# Patient Record
Sex: Female | Born: 1965
Health system: Southern US, Community
[De-identification: ages and names within clinical notes are randomized; demographics above are authoritative.]

## PROBLEM LIST (undated history)

## (undated) DIAGNOSIS — D649 Anemia, unspecified: Secondary | ICD-10-CM

## (undated) HISTORY — PX: TUBAL LIGATION: SHX77

## (undated) HISTORY — DX: Anemia, unspecified: D64.9

---

## 2003-09-20 ENCOUNTER — Ambulatory Visit (HOSPITAL_COMMUNITY): Admission: RE | Admit: 2003-09-20 | Discharge: 2003-09-20 | Payer: Self-pay | Admitting: Obstetrics and Gynecology

## 2005-03-19 ENCOUNTER — Encounter: Admission: RE | Admit: 2005-03-19 | Discharge: 2005-03-19 | Payer: Self-pay | Admitting: *Deleted

## 2005-04-02 ENCOUNTER — Encounter: Admission: RE | Admit: 2005-04-02 | Discharge: 2005-04-02 | Payer: Self-pay | Admitting: *Deleted

## 2005-04-02 ENCOUNTER — Encounter (INDEPENDENT_AMBULATORY_CARE_PROVIDER_SITE_OTHER): Payer: Self-pay | Admitting: Specialist

## 2005-09-13 ENCOUNTER — Encounter: Admission: RE | Admit: 2005-09-13 | Discharge: 2005-09-13 | Payer: Self-pay | Admitting: *Deleted

## 2006-03-25 ENCOUNTER — Encounter: Admission: RE | Admit: 2006-03-25 | Discharge: 2006-03-25 | Payer: Self-pay | Admitting: *Deleted

## 2006-04-11 ENCOUNTER — Encounter: Admission: RE | Admit: 2006-04-11 | Discharge: 2006-04-11 | Payer: Self-pay | Admitting: *Deleted

## 2007-03-31 ENCOUNTER — Encounter: Admission: RE | Admit: 2007-03-31 | Discharge: 2007-03-31 | Payer: Self-pay

## 2008-04-05 ENCOUNTER — Encounter: Admission: RE | Admit: 2008-04-05 | Discharge: 2008-04-05 | Payer: Self-pay

## 2008-08-05 ENCOUNTER — Encounter: Admission: RE | Admit: 2008-08-05 | Discharge: 2008-08-05 | Payer: Self-pay | Admitting: Specialist

## 2009-04-07 ENCOUNTER — Encounter: Admission: RE | Admit: 2009-04-07 | Discharge: 2009-04-07 | Payer: Self-pay | Admitting: Physician Assistant

## 2010-04-17 ENCOUNTER — Encounter: Admission: RE | Admit: 2010-04-17 | Discharge: 2010-04-17 | Payer: Self-pay | Admitting: Physician Assistant

## 2010-10-23 NOTE — H&P (Signed)
NAME:  Ariana, [name] NO.:  [id number]   MEDICAL RECORD NO.:  [id number]                   PATIENT TYPE:   LOCATION:                                       FACILITY:   PHYSICIAN:  Lenoard Aden, M.D.             DATE OF BIRTH:   DATE OF ADMISSION:  DATE OF DISCHARGE:                                HISTORY & PHYSICAL   CHIEF COMPLAINT:  Desire for elective sterilization.   HISTORY OF PRESENT ILLNESS:  The patient is a 45 year old Hispanic female,  G4, P3, who desires permanent sterilization.   PAST GYN HISTORY:  Noncontributory.   PREGNANCY HISTORY:  One uncomplicated abortion, three uncomplicated vaginal  deliveries.   MEDICATIONS:  Oral contraception.   FAMILY HISTORY:  Family history of kidney disease.   SOCIAL HISTORY:  She is a nonsmoker, nondrinker, denies domestic or physical  violence.   PHYSICAL EXAMINATION:  GENERAL:  She is a well-developed, well-nourished  Hispanic female in no acute distress.  HEENT:  Normal.  LUNGS:  Clear.  HEART:  Regular rhythm.  ABDOMEN:  Soft, nontender.  PELVIC:  Exam shows normal sized uterus, no __________masses.   IMPRESSION:  Desire for elective sterilization.   PLAN:  Proceed with laparoscopic tubal ligation.  Risks of anesthesia,  infection, bleeding, injury to vital organs, need for repair are discussed.  The__________complications to include bowel and bladder injury are noted.  The risk of tubal ligation in 5-10 per thousand is noted.  The patient  acknowledges and wishes to proceed.                                               Lenoard Aden, M.D.    RJT/MEDQ  D:  09/19/2003  T:  09/19/2003  Job:  [id number]

## 2010-10-23 NOTE — Op Note (Signed)
NAME:  [place], [facility]                           ACCOUNT NO.:  [id number]   MEDICAL RECORD NO.:  [id number]                   PATIENT TYPE:  AMB   LOCATION:  SDC                                  FACILITY:  [facility]   PHYSICIAN:  Lenoard Aden, M.D.             DATE OF BIRTH:  07/21/1965   DATE OF PROCEDURE:  09/20/2003  DATE OF DISCHARGE:                                 OPERATIVE REPORT   PREOPERATIVE DIAGNOSIS:  Elective sterilization.   POSTOPERATIVE DIAGNOSIS:  Elective sterilization.   PROCEDURE:  Laparoscopic tubal sterilization.   ANESTHESIA:  General.   ESTIMATED BLOOD LOSS:  Less than 50 cc.   COMPLICATIONS:  None.   DRAINS:  None.   COUNTS:  Correct.   DISPOSITION:  Patient to recovery in good condition.   FINDINGS:  Normal uterus.  Bilateral normal tubes and ovaries.  Normal  appendiceal bed.  Appendix not visualized.  Normal liver and gallbladder  bed.   BRIEF OPERATIVE NOTE:  After being apprised of the risks of anesthesia,  infection, bleeding, injury to abdominal organs with need for repair,  delayed version or immediate complications to include: bowel and bladder  injury, failure risk of tubal ligation of 5:10,000.   The patient is brought to the operating room, where she was administered  general anesthetic without complications; prepped and draped in usual  sterile fashion. She was catheterized until the bladder was emptied.  After  achieving adequate anesthesia, dilute Marcaine solution is placed in the  area of Pfannenstiel skin incision, which was made with the scalpel.  This  was carried down to the fascia, which is nicked in the midline and opened  transversely.  Infraumbilical incision made with the scalpel.  Veress needle  placed, opening pressure -2 noted. Four liters of CO2 insufflated without  difficulty.  Hanging drop test is negative.  The trocar is placed without  difficulty and a normal intraperitoneal entry without evidence of trauma is  documented.  Pictures are taken of normal liver, gallbladder bed,  appendiceal bed appears normal.  Normal tubes, ovaries and uterus.  Normal  anterior and posterior cul-de-sac is noted.  Clevenger bipolar cautery is  entered through the operative port of the scope.  The right tube is traced  out to the fimbriated end, fulgurated with bipolar cautery down to  resistance of 0 at three contiguous portions of the ampulla, isthmic portion  of the tube.  The same procedure is done on the left tube.  Tubal fimbria  are visualized bilaterally.  Tubes are divided.  Lumens are visualized using  long scissors, and CO2 is released.  Good hemostasis is  noted.  CO2 is then reentered into the belly, achieving good hemostasis.  All instruments are removed from the abdomen.  Incision is closed using a 0  Vicryl and Dermabond.  Instruments are removed from the vagina.  The patient  tolerated the procedure well and  is transferred to recovery in good  condition.                                               Lenoard Aden, M.D.    RJT/MEDQ  D:  09/20/2003  T:  09/21/2003  Job:  [id number]

## 2011-03-22 ENCOUNTER — Other Ambulatory Visit: Payer: Self-pay | Admitting: Internal Medicine

## 2011-03-22 DIAGNOSIS — Z1231 Encounter for screening mammogram for malignant neoplasm of breast: Secondary | ICD-10-CM

## 2011-04-23 ENCOUNTER — Ambulatory Visit
Admission: RE | Admit: 2011-04-23 | Discharge: 2011-04-23 | Disposition: A | Discharge status: Home / Self Care | Payer: BC Managed Care – PPO | Source: Ambulatory Visit | Attending: Internal Medicine | Admitting: Internal Medicine

## 2011-04-23 DIAGNOSIS — Z1231 Encounter for screening mammogram for malignant neoplasm of breast: Secondary | ICD-10-CM

## 2012-03-09 ENCOUNTER — Other Ambulatory Visit: Payer: Self-pay | Admitting: Specialist

## 2012-03-09 DIAGNOSIS — Z1231 Encounter for screening mammogram for malignant neoplasm of breast: Secondary | ICD-10-CM

## 2012-04-28 ENCOUNTER — Ambulatory Visit
Admission: RE | Admit: 2012-04-28 | Discharge: 2012-04-28 | Disposition: A | Discharge status: Home / Self Care | Payer: BC Managed Care – PPO | Source: Ambulatory Visit | Attending: Specialist | Admitting: Specialist

## 2012-04-28 DIAGNOSIS — Z1231 Encounter for screening mammogram for malignant neoplasm of breast: Secondary | ICD-10-CM

## 2013-03-15 ENCOUNTER — Other Ambulatory Visit: Payer: Self-pay

## 2013-03-15 DIAGNOSIS — Z1231 Encounter for screening mammogram for malignant neoplasm of breast: Secondary | ICD-10-CM

## 2013-04-30 ENCOUNTER — Ambulatory Visit
Admission: RE | Admit: 2013-04-30 | Discharge: 2013-04-30 | Disposition: A | Discharge status: Home / Self Care | Payer: BC Managed Care – PPO | Source: Ambulatory Visit

## 2013-04-30 DIAGNOSIS — Z1231 Encounter for screening mammogram for malignant neoplasm of breast: Secondary | ICD-10-CM

## 2013-06-25 ENCOUNTER — Ambulatory Visit (INDEPENDENT_AMBULATORY_CARE_PROVIDER_SITE_OTHER): Payer: BC Managed Care – PPO | Admitting: Internal Medicine

## 2013-06-25 ENCOUNTER — Ambulatory Visit: Payer: BC Managed Care – PPO

## 2013-06-25 VITALS — BP 118/80 | HR 65 | Temp 98.6°F | Resp 16 | Ht 64.5 in | Wt 152.0 lb

## 2013-06-25 DIAGNOSIS — M25552 Pain in left hip: Secondary | ICD-10-CM

## 2013-06-25 DIAGNOSIS — M79609 Pain in unspecified limb: Secondary | ICD-10-CM

## 2013-06-25 DIAGNOSIS — M79605 Pain in left leg: Secondary | ICD-10-CM

## 2013-06-25 DIAGNOSIS — M25559 Pain in unspecified hip: Secondary | ICD-10-CM

## 2013-06-25 DIAGNOSIS — R202 Paresthesia of skin: Secondary | ICD-10-CM

## 2013-06-25 DIAGNOSIS — R209 Unspecified disturbances of skin sensation: Secondary | ICD-10-CM

## 2013-06-25 LAB — POCT CBC
Granulocyte percent: 52.6 %G (ref 37–80)
HCT, POC: 38.7 % (ref 37.7–47.9)
Hemoglobin: 11.8 g/dL — AB (ref 12.2–16.2)
Lymph, poc: 1.9 (ref 0.6–3.4)
MCH, POC: 26 pg — AB (ref 27–31.2)
MCHC: 30.5 g/dL — AB (ref 31.8–35.4)
MCV: 85.4 fL (ref 80–97)
MID (cbc): 0.4 (ref 0–0.9)
MPV: 10.2 fL (ref 0–99.8)
POC Granulocyte: 2.5 (ref 2–6.9)
POC LYMPH PERCENT: 40 %L (ref 10–50)
POC MID %: 7.4 %M (ref 0–12)
Platelet Count, POC: 237 10*3/uL (ref 142–424)
RBC: 4.53 M/uL (ref 4.04–5.48)
RDW, POC: 16.3 %
WBC: 4.8 10*3/uL (ref 4.6–10.2)

## 2013-06-25 LAB — POCT SEDIMENTATION RATE: POCT SED RATE: 36 mm/hr — AB (ref 0–22)

## 2013-06-25 MED ORDER — METHYLPREDNISOLONE ACETATE 80 MG/ML IJ SUSP
80.0000 mg | Freq: Once | INTRAMUSCULAR | Status: AC
  Administered 2013-06-25: 80 mg via INTRAMUSCULAR

## 2013-06-25 MED ORDER — NAPROXEN 500 MG PO TABS: 500.0000 mg | ORAL_TABLET | Freq: Two times a day (BID) | ORAL | Status: DC

## 2013-06-25 NOTE — Progress Notes (Signed)
   Subjective:    Patient ID: Ariana ReichmannSilvia [name], female    DOB: [birth year], 48 y.o.   MRN: [id number]  HPI    Review of Systems     Objective:   Physical Exam        Assessment & Plan:

## 2013-06-25 NOTE — Progress Notes (Signed)
   Subjective:    Patient ID: Ariana ReichmannSilvia Canlas, female    DOB: Oct 24, 1965, 48 y.o.   MRN: [id number]  HPI Pt here c/o of hip pain left side started last year comes and goes.  Worse with movement. radiates to left leg. 10/10 with movement.  Denies pain with urination. Bowel moving okay.  Some tingling and numbness.  otc with some relief.  Activity, walking, working can trigger pain, but bending, flexing does not trigger pain. Pain will ache into anterior left leg to the knee.   Review of Systems     Objective:   Physical Exam  Constitutional: She appears well-developed and well-nourished. No distress.  HENT:  Head: Normocephalic.  Eyes: EOM are normal.  Neck: Normal range of motion.  Cardiovascular: Normal rate.   Pulmonary/Chest: Effort normal.  Musculoskeletal:       Left hip: She exhibits tenderness and bony tenderness. She exhibits normal range of motion, normal strength, no swelling, no crepitus and no deformity.       Lumbar back: She exhibits normal range of motion, no tenderness, no bony tenderness, no pain, no spasm and normal pulse.  Neurological: She is alert. No cranial nerve deficit or sensory deficit. She exhibits normal muscle tone. Coordination and gait normal.   Pain with external rotation left hip,internal rotation and palpation lateral posterior hip  [facility] reading (PRIMARY) by  Dr Perrin MalteseGuest hip and ls spine appear normal on xr  Results for orders placed in visit on 06/25/13  POCT CBC      Result Value Range   WBC 4.8  4.6 - 10.2 K/uL   Lymph, poc 1.9  0.6 - 3.4   POC LYMPH PERCENT 40.0  10 - 50 %L   MID (cbc) 0.4  0 - 0.9   POC MID % 7.4  0 - 12 %M   POC Granulocyte 2.5  2 - 6.9   Granulocyte percent 52.6  37 - 80 %G   RBC 4.53  4.04 - 5.48 M/uL   Hemoglobin 11.8 (*) 12.2 - 16.2 g/dL   HCT, POC 40.938.7  81.137.7 - 47.9 %   MCV 85.4  80 - 97 fL   MCH, POC 26.0 (*) 27 - 31.2 pg   MCHC 30.5 (*) 31.8 - 35.4 g/dL   RDW, POC 91.416.3     Platelet Count, POC 237  142 - 424  K/uL   MPV 10.2  0 - 99.8 fL          Assessment & Plan:  Left hip and leg pain/Possible bursitis Depomedrol 80mg /Naprosyn 500mg  bid pc Reck Friday 30th

## 2013-06-25 NOTE — Patient Instructions (Addendum)
Hip Exercises RANGE OF MOTION (ROM) AND STRETCHING EXERCISES  These exercises may help you when beginning to rehabilitate your injury. Doing them too aggressively can worsen your condition. Complete them slowly and gently. Your symptoms may resolve with or without further involvement from your physician, physical therapist or athletic trainer. While completing these exercises, remember:   Restoring tissue flexibility helps normal motion to return to the joints. This allows healthier, less painful movement and activity.  An effective stretch should be held for at least 30 seconds.  A stretch should never be painful. You should only feel a gentle lengthening or release in the stretched tissue. If these stretches worsen your symptoms even when done gently, consult your physician, physical therapist or athletic trainer. STRETCH Hamstrings, Supine   Lie on your back. Loop a belt or towel over the ball of your right / left foot.  Straighten your right / left knee and slowly pull on the belt to raise your leg. Do not allow the right / left knee to bend. Keep your opposite leg flat on the floor.  Raise the leg until you feel a gentle stretch behind your right / left knee or thigh. Hold this position for __________ seconds. Repeat __________ times. Complete this stretch __________ times per day.  STRETCH - Hip Rotators   Lie on your back on a firm surface. Grasp your right / left knee with your right / left hand and your ankle with your opposite hand.  Keeping your hips and shoulders firmly planted, gently pull your right / left knee and rotate your lower leg toward your opposite shoulder until you feel a stretch in your buttocks.  Hold this stretch for __________ seconds. Repeat this stretch __________ times. Complete this stretch __________ times per day. STRETCH - Hamstrings/Adductors, V-Sit   Sit on the floor with your legs extended in a large "V," keeping your knees straight.  With your head  and chest upright, bend at your waist reaching for your right foot to stretch your left adductors.  You should feel a stretch in your left inner thigh. Hold for __________ seconds.  Return to the upright position to relax your leg muscles.  Continuing to keep your chest upright, bend straight forward at your waist to stretch your hamstrings.  You should feel a stretch behind both of your thighs and/or knees. Hold for __________ seconds.  Return to the upright position to relax your leg muscles.  Repeat steps 2 through 4 for opposite leg. Repeat __________ times. Complete this exercise __________ times per day.  STRETCHING - Hip Flexors, Lunge  Half kneel with your right / left knee on the floor and your opposite knee bent and directly over your ankle.  Keep good posture with your head over your shoulders. Tighten your buttocks to point your tailbone downward; this will prevent your back from arching too much.  You should feel a gentle stretch in the front of your thigh and/or hip. If you do not feel any resistance, slightly slide your opposite foot forward and then slowly lunge forward so your knee once again lines up over your ankle. Be sure your tailbone remains pointed downward.  Hold this stretch for __________ seconds. Repeat __________ times. Complete this stretch __________ times per day. STRENGTHENING EXERCISES These exercises may help you when beginning to rehabilitate your injury. They may resolve your symptoms with or without further involvement from your physician, physical therapist or athletic trainer. While completing these exercises, remember:   Muscles can  gain both the endurance and the strength needed for everyday activities through controlled exercises.  Complete these exercises as instructed by your physician, physical therapist or athletic trainer. Progress the resistance and repetitions only as guided.  You may experience muscle soreness or fatigue, but the pain  or discomfort you are trying to eliminate should never worsen during these exercises. If this pain does worsen, stop and make certain you are following the directions exactly. If the pain is still present after adjustments, discontinue the exercise until you can discuss the trouble with your clinician. STRENGTH - Hip Extensors, Bridge   Lie on your back on a firm surface. Bend your knees and place your feet flat on the floor.  Tighten your buttocks muscles and lift your bottom off the floor until your trunk is level with your thighs. You should feel the muscles in your buttocks and back of your thighs working. If you do not feel these muscles, slide your feet 1-2 inches further away from your buttocks.  Hold this position for __________ seconds.  Slowly lower your hips to the starting position and allow your buttock muscles relax completely before beginning the next repetition.  If this exercise is too easy, you may cross your arms over your chest. Repeat __________ times. Complete this exercise __________ times per day.  STRENGTH - Hip Abductors, Straight Leg Raises  Be aware of your form throughout the entire exercise so that you exercise the correct muscles. Sloppy form means that you are not strengthening the correct muscles.  Lie on your side so that your head, shoulders, knee and hip line up. You may bend your lower knee to help maintain your balance. Your right / left leg should be on top.  Roll your hips slightly forward, so that your hips are stacked directly over each other and your right / left knee is facing forward.  Lift your top leg up 4-6 inches, leading with your heel. Be sure that your foot does not drift forward or that your knee does not roll toward the ceiling.  Hold this position for __________ seconds. You should feel the muscles in your outer hip lifting (you may not notice this until your leg begins to tire).  Slowly lower your leg to the starting position. Allow the  muscles to fully relax before beginning the next repetition. Repeat __________ times. Complete this exercise __________ times per day.  STRENGTH - Hip Adductors, Straight Leg Raises   Lie on your side so that your head, shoulders, knee and hip line up. You may place your upper foot in front to help maintain your balance. Your right / left leg should be on the bottom.  Roll your hips slightly forward, so that your hips are stacked directly over each other and your right / left knee is facing forward.  Tense the muscles in your inner thigh and lift your bottom leg 4-6 inches. Hold this position for __________ seconds.  Slowly lower your leg to the starting position. Allow the muscles to fully relax before beginning the next repetition. Repeat __________ times. Complete this exercise __________ times per day.  STRENGTH - Quadriceps, Straight Leg Raises  Quality counts! Watch for signs that the quadriceps muscle is working to insure you are strengthening the correct muscles and not "cheating" by substituting with healthier muscles.  Lay on your back with your right / left leg extended and your opposite knee bent.  Tense the muscles in the front of your right / left thigh.  You should see either your knee cap slide up or increased dimpling just above the knee. Your thigh may even quiver.  Tighten these muscles even more and raise your leg 4 to 6 inches off the floor. Hold for right / left seconds.  Keeping these muscles tense, lower your leg.  Relax the muscles slowly and completely in between each repetition. Repeat __________ times. Complete this exercise __________ times per day.  STRENGTH - Hip Abductors, Standing  Tie one end of a rubber exercise band/tubing to a secure surface (table, pole) and tie a loop at the other end.  Place the loop around your right / left ankle. Keeping your ankle with the band directly opposite of the secured end, step away until there is tension in the  tube/band.  Hold onto a chair as needed for balance.  Keeping your back upright, your shoulders over your hips, and your toes pointing forward, lift your right / left leg out to your side. Be sure to lift your leg with your hip muscles. Do not "throw" your leg or tip your body to lift your leg.  Slowly and with control, return to the starting position. Repeat exercise __________ times. Complete this exercise __________ times per day.  STRENGTH  Quadriceps, Squats  Stand in a door frame so that your feet and knees are in line with the frame.  Use your hands for balance, not support, on the frame.  Slowly lower your weight, bending at the hips and knees. Keep your lower legs upright so that they are parallel with the door frame. Squat only within the range that does not increase your knee pain. Never let your hips drop below your knees.  Slowly return upright, pushing with your legs, not pulling with your hands. Document Released: 06/11/2005 Document Revised: 08/16/2011 Document Reviewed: 09/05/2008 [facility] Patient Information 2014 [place], [place]. Hip Bursitis Bursitis is a swelling and soreness (inflammation) of a fluid-filled sac (bursa). This sac overlies and protects the joints.  CAUSES   Injury.  Overuse of the muscles surrounding the joint.  Arthritis.  Gout.  Infection.  Cold weather.  Inadequate warm-up and conditioning prior to activities. The cause may not be known.  SYMPTOMS   Mild to severe irritation.  Tenderness and swelling over the outside of the hip.  Pain with motion of the hip.  If the bursa becomes infected, a fever may be present. Redness, tenderness, and warmth will develop over the hip. Symptoms usually lessen in 3 to 4 weeks with treatment, but can come back. TREATMENT If conservative treatment does not work, your caregiver may advise draining the bursa and injecting cortisone into the area. This may speed up the healing process. This may also  be used as an initial treatment of choice. HOME CARE INSTRUCTIONS   Apply ice to the affected area for 15-20 minutes every 3 to 4 hours while awake for the first 2 days. Put the ice in a plastic bag and place a towel between the bag of ice and your skin.  Rest the painful joint as much as possible, but continue to put the joint through a normal range of motion at least 4 times per day. When the pain lessens, begin normal, slow movements and usual activities to help prevent stiffness of the hip.  Only take over-the-counter or prescription medicines for pain, discomfort, or fever as directed by your caregiver.  Use crutches to limit weight bearing on the hip joint, if advised.  Elevate your painful hip to  reduce swelling. Use pillows for propping and cushioning your legs and hips.  Gentle massage may provide comfort and decrease swelling. SEEK IMMEDIATE MEDICAL CARE IF:   Your pain increases even during treatment, or you are not improving.  You have a fever.  You have heat and inflammation over the involved bursa.  You have any other questions or concerns. MAKE SURE YOU:   Understand these instructions.  Will watch your condition.  Will get help right away if you are not doing well or get worse. Document Released: 11/13/2001 Document Revised: 08/16/2011 Document Reviewed: 06/12/2008 [facility] Patient Information 2014 [place], [place].

## 2013-06-29 ENCOUNTER — Ambulatory Visit (INDEPENDENT_AMBULATORY_CARE_PROVIDER_SITE_OTHER): Payer: BC Managed Care – PPO | Admitting: Physician Assistant

## 2013-06-29 VITALS — BP 120/72 | HR 62 | Temp 98.0°F | Resp 18 | Ht 64.5 in | Wt 153.0 lb

## 2013-06-29 DIAGNOSIS — M25552 Pain in left hip: Secondary | ICD-10-CM

## 2013-06-29 DIAGNOSIS — D649 Anemia, unspecified: Secondary | ICD-10-CM

## 2013-06-29 DIAGNOSIS — M25559 Pain in unspecified hip: Secondary | ICD-10-CM

## 2013-06-29 LAB — IRON AND TIBC
%SAT: 6 % — ABNORMAL LOW (ref 20–55)
Iron: 25 ug/dL — ABNORMAL LOW (ref 42–145)
TIBC: 442 ug/dL (ref 250–470)
UIBC: 417 ug/dL — ABNORMAL HIGH (ref 125–400)

## 2013-06-29 LAB — POCT CBC
Granulocyte percent: 60.5 %G (ref 37–80)
HCT, POC: 37.6 % — AB (ref 37.7–47.9)
Hemoglobin: 11.1 g/dL — AB (ref 12.2–16.2)
Lymph, poc: 2.6 (ref 0.6–3.4)
MCH, POC: 25.6 pg — AB (ref 27–31.2)
MCHC: 29.5 g/dL — AB (ref 31.8–35.4)
MCV: 86.7 fL (ref 80–97)
MID (cbc): 0.4 (ref 0–0.9)
MPV: 9.9 fL (ref 0–99.8)
POC Granulocyte: 4.5 (ref 2–6.9)
POC LYMPH PERCENT: 34.6 %L (ref 10–50)
POC MID %: 4.9 %M (ref 0–12)
Platelet Count, POC: 244 10*3/uL (ref 142–424)
RBC: 4.34 M/uL (ref 4.04–5.48)
RDW, POC: 15.6 %
WBC: 7.4 10*3/uL (ref 4.6–10.2)

## 2013-06-29 LAB — FERRITIN: Ferritin: 1 ng/mL — ABNORMAL LOW (ref 10–291)

## 2013-06-29 NOTE — Progress Notes (Signed)
   Subjective:    Patient ID: Ariana ReichmannSilvia [name], female    DOB: Jan 19, 1966, 48 y.o.   MRN: [id number]  HPI  Patient presents to clinic for follow up of left hip pain.  Left hip pain has significantly improved since 06/25/13.  Notes occasionally pain radiating down left leg and mild numbness and tingling in left leg.  Movement no longer seems to make pain worse and has not had worsened pain while working.  Denies urinary symptoms and changes in bowel movements.  Reports taking Naproxen daily.   CBC from 06/25/13 showed patient was mildly anemic. Has experienced mild fatigue and lightheadedness recently. Reports occasional hematochezia, most often with constipation.    Review of Systems As above.     Objective:   Physical Exam  Constitutional: She is oriented to person, place, and time. She appears well-developed and well-nourished.  HENT:  Head: Normocephalic and atraumatic.  Eyes: Conjunctivae are normal.  Neck: Normal range of motion. Neck supple.  Cardiovascular: Normal rate, regular rhythm, normal heart sounds and intact distal pulses.   Pulmonary/Chest: Effort normal and breath sounds normal.  Musculoskeletal:       Right hip: Normal.       Left hip: She exhibits tenderness. She exhibits normal range of motion and normal strength.       Right knee: Normal.       Left knee: Normal.       Right ankle: Normal.       Left ankle: Normal.  Left hip pain reproducible with flexion of hip.    Neurological: She is alert and oriented to person, place, and time.  Skin: Skin is warm and dry.  Psychiatric: She has a normal mood and affect. Her behavior is normal. Judgment and thought content normal.    Results for orders placed in visit on 06/29/13  POCT CBC      Result Value Range   WBC 7.4  4.6 - 10.2 K/uL   Lymph, poc 2.6  0.6 - 3.4   POC LYMPH PERCENT 34.6  10 - 50 %L   MID (cbc) 0.4  0 - 0.9   POC MID % 4.9  0 - 12 %M   POC Granulocyte 4.5  2 - 6.9   Granulocyte percent 60.5   37 - 80 %G   RBC 4.34  4.04 - 5.48 M/uL   Hemoglobin 11.1 (*) 12.2 - 16.2 g/dL   HCT, POC 55.737.6 (*) 32.237.7 - 47.9 %   MCV 86.7  80 - 97 fL   MCH, POC 25.6 (*) 27 - 31.2 pg   MCHC 29.5 (*) 31.8 - 35.4 g/dL   RDW, POC 02.515.6     Platelet Count, POC 244  142 - 424 K/uL   MPV 9.9  0 - 99.8 fL        Assessment & Plan:   1. Anemia Patient still mildly anemic - Hgb has slightly decreased from 06/25/13. Will call with results of serum iron, ferritin and TIBC. Patient is to return stool cards in next couple of days. Will schedule follow up appointment according to test results. Consider referral to GI based on hx of hematochezia and pending hemoccult results.  - POCT CBC - Iron and TIBC - Ferritin - IFOBT POC (occult bld, rslt in office)  2. Hip pain, left Continue naproxen for hip pain. Follow up as needed.

## 2013-06-29 NOTE — Patient Instructions (Signed)
Return the stool collection kit once you have collected a specimen.

## 2013-06-30 NOTE — Progress Notes (Signed)
I have examined this patient along with the student and agree.  

## 2013-07-06 ENCOUNTER — Telehealth: Payer: Self-pay

## 2013-07-06 NOTE — Telephone Encounter (Signed)
Patient is calling to get her lab resuts. Please call her at her work number [phone]

## 2013-07-07 NOTE — Telephone Encounter (Signed)
Lab spoke with pt already.

## 2013-07-09 LAB — IFOBT (OCCULT BLOOD): IFOBT: NEGATIVE

## 2013-07-11 NOTE — Progress Notes (Signed)
Pt aware of results and will rtc in 4 weeks.

## 2013-07-19 ENCOUNTER — Other Ambulatory Visit: Payer: Self-pay | Admitting: Family Medicine

## 2013-07-19 DIAGNOSIS — D62 Acute posthemorrhagic anemia: Secondary | ICD-10-CM

## 2013-07-30 ENCOUNTER — Encounter: Payer: Self-pay | Admitting: Gastroenterology

## 2013-08-08 ENCOUNTER — Ambulatory Visit (INDEPENDENT_AMBULATORY_CARE_PROVIDER_SITE_OTHER): Payer: BC Managed Care – PPO | Admitting: Family Medicine

## 2013-08-08 VITALS — BP 128/68 | HR 72 | Temp 99.0°F | Resp 16 | Ht 64.0 in | Wt 155.0 lb

## 2013-08-08 DIAGNOSIS — R05 Cough: Secondary | ICD-10-CM

## 2013-08-08 DIAGNOSIS — R059 Cough, unspecified: Secondary | ICD-10-CM

## 2013-08-08 MED ORDER — HYDROCODONE-HOMATROPINE 5-1.5 MG/5ML PO SYRP: ORAL_SOLUTION | ORAL | Status: DC

## 2013-08-08 MED ORDER — AZITHROMYCIN 250 MG PO TABS: ORAL_TABLET | ORAL | Status: DC

## 2013-08-08 NOTE — Progress Notes (Addendum)
Subjective:    Patient ID: Ariana [name] Moncayo, female    DOB: 1965/09/05, 48 y.o.   MRN: [id number] This chart was scribed for Meredith StaggersJeffrey Adham Johnson, MD by Nicholos Johnsenise Iheanachor, Medical Scribe. This patient's care was started at 7:00 PM.  HPI HPI Comments: Ariana [name] Nations is a 48 y.o. female who presents to the [facility] complaining of gradually worsening dry cough and some intermitted productive cough with associated sore back and chest, onset 10 days ago. Says she feels like she wants to get rid of something in her chest but cannot. States cough started getting really bad 4 days ago, especially at night, interfering with her sleep. Reported some rhinorrhea and congestion 4-5 days ago that has since resolved. Tried Mucinex and some OTC medications with little relief. Denies hx of asthma or COPD. Denies fever, body aches, SOB, abdominal pain.   There are no active problems to display for this patient.  No past medical history on file. No past surgical history on file. No Known Allergies Prior to Admission medications   Medication Sig Start Date End Date Taking? Authorizing Provider  naproxen (NAPROSYN) 500 MG tablet Take 1 tablet (500 mg total) by mouth 2 (two) times daily with a meal. 06/25/13   Jonita Albeehris W Guest, MD   History   Social History  . Marital Status: Married    Spouse Name: N/A    Number of Children: N/A  . Years of Education: N/A   Occupational History  . Not on file.   Social History Main Topics  . Smoking status: Never Smoker   . Smokeless tobacco: Not on file  . Alcohol Use: No  . Drug Use: No  . Sexual Activity: Not on file   Other Topics Concern  . Not on file   Social History Narrative  . No narrative on file   Review of Systems  Constitutional: Negative for fever.  Respiratory: Positive for cough. Negative for shortness of breath.   Gastrointestinal: Negative for abdominal pain.  Musculoskeletal: Negative for myalgias.   Objective:  Physical  Exam  Vitals reviewed. Constitutional: She is oriented to person, place, and time. She appears well-developed and well-nourished. No distress.  HENT:  Head: Normocephalic and atraumatic.  Right Ear: Hearing, tympanic membrane, external ear and ear canal normal.  Left Ear: Hearing, tympanic membrane, external ear and ear canal normal.  Nose: Nose normal.  Mouth/Throat: Oropharynx is clear and moist. No oropharyngeal exudate.  Small amount of clear fluid at base of right TM. No effusion or erythema. Left ear normal.  Eyes: Conjunctivae and EOM are normal. Pupils are equal, round, and reactive to light.  Cardiovascular: Normal rate, regular rhythm, normal heart sounds and intact distal pulses.   No murmur heard. Pulmonary/Chest: Effort normal and breath sounds normal. No respiratory distress. She has no wheezes. She has no rhonchi.  No E to A changes. No fremitus.   Neurological: She is alert and oriented to person, place, and time.  Skin: Skin is warm and dry. No rash noted.  Psychiatric: She has a normal mood and affect. Her behavior is normal.    Assessment & Plan:    Ariana [name] Gassett is a 48 y.o. female Cough - Plan: azithromycin (ZITHROMAX) 250 MG tablet, HYDROcodone-homatropine (HYCODAN) 5-1.5 MG/5ML syrup   Meds ordered this encounter  Medications  . azithromycin (ZITHROMAX) 250 MG tablet    Sig: Take 2 pills by mouth on day 1, then 1 pill by mouth per day on days 2  through 5.    Dispense:  6 each    Refill:  0  . HYDROcodone-homatropine (HYCODAN) 5-1.5 MG/5ML syrup    Sig: 92m by mouth a bedtime as needed for cough.    Dispense:  120 mL    Refill:  0   Initial URI, now with increased cough.  Early bronchitis - viral still probable, but with subjective incr cough - can start z pak in next few days.  Sx care with hycodan qhs prn, mucinex and fluids during the day if needed. rtc precautions.  Patient Instructions  Hydrocodone cough syrup if needed to block cough at night, over  the counter mucinex or mucinex DM during the day, drink plenty of fluids.  If not starting to improve in next few days, can start antibiotic (Zpak). Return to the clinic or go to the nearest emergency room if any of your symptoms worsen or new symptoms occur.  Cough, Adult  A cough is a reflex that helps clear your throat and airways. It can help heal the body or may be a reaction to an irritated airway. A cough may only last 2 or 3 weeks (acute) or may last more than 8 weeks (chronic).  CAUSES Acute cough:  Viral or bacterial infections. Chronic cough:  Infections.  Allergies.  Asthma.  Post-nasal drip.  Smoking.  Heartburn or acid reflux.  Some medicines.  Chronic lung problems (COPD).  Cancer. SYMPTOMS   Cough.  Fever.  Chest pain.  Increased breathing rate.  High-pitched whistling sound when breathing (wheezing).  Colored mucus that you cough up (sputum). TREATMENT   A bacterial cough may be treated with antibiotic medicine.  A viral cough must run its course and will not respond to antibiotics.  Your caregiver may recommend other treatments if you have a chronic cough. HOME CARE INSTRUCTIONS   Only take over-the-counter or prescription medicines for pain, discomfort, or fever as directed by your caregiver. Use cough suppressants only as directed by your caregiver.  Use a cold steam vaporizer or humidifier in your bedroom or home to help loosen secretions.  Sleep in a semi-upright position if your cough is worse at night.  Rest as needed.  Stop smoking if you smoke. SEEK IMMEDIATE MEDICAL CARE IF:   You have pus in your sputum.  Your cough starts to worsen.  You cannot control your cough with suppressants and are losing sleep.  You begin coughing up blood.  You have difficulty breathing.  You develop pain which is getting worse or is uncontrolled with medicine.  You have a fever. MAKE SURE YOU:   Understand these instructions.  Will  watch your condition.  Will get help right away if you are not doing well or get worse. Document Released: 11/20/2010 Document Revised: 08/16/2011 Document Reviewed: 11/20/2010 [facility] Patient Information 2014 [place], [place].

## 2013-08-08 NOTE — Patient Instructions (Signed)
Hydrocodone cough syrup if needed to block cough at night, over the counter mucinex or mucinex DM during the day, drink plenty of fluids.  If not starting to improve in next few days, can start antibiotic (Zpak). Return to the clinic or go to the nearest emergency room if any of your symptoms worsen or new symptoms occur.  Cough, Adult  A cough is a reflex that helps clear your throat and airways. It can help heal the body or may be a reaction to an irritated airway. A cough may only last 2 or 3 weeks (acute) or may last more than 8 weeks (chronic).  CAUSES Acute cough:  Viral or bacterial infections. Chronic cough:  Infections.  Allergies.  Asthma.  Post-nasal drip.  Smoking.  Heartburn or acid reflux.  Some medicines.  Chronic lung problems (COPD).  Cancer. SYMPTOMS   Cough.  Fever.  Chest pain.  Increased breathing rate.  High-pitched whistling sound when breathing (wheezing).  Colored mucus that you cough up (sputum). TREATMENT   A bacterial cough may be treated with antibiotic medicine.  A viral cough must run its course and will not respond to antibiotics.  Your caregiver may recommend other treatments if you have a chronic cough. HOME CARE INSTRUCTIONS   Only take over-the-counter or prescription medicines for pain, discomfort, or fever as directed by your caregiver. Use cough suppressants only as directed by your caregiver.  Use a cold steam vaporizer or humidifier in your bedroom or home to help loosen secretions.  Sleep in a semi-upright position if your cough is worse at night.  Rest as needed.  Stop smoking if you smoke. SEEK IMMEDIATE MEDICAL CARE IF:   You have pus in your sputum.  Your cough starts to worsen.  You cannot control your cough with suppressants and are losing sleep.  You begin coughing up blood.  You have difficulty breathing.  You develop pain which is getting worse or is uncontrolled with medicine.  You have a  fever. MAKE SURE YOU:   Understand these instructions.  Will watch your condition.  Will get help right away if you are not doing well or get worse. Document Released: 11/20/2010 Document Revised: 08/16/2011 Document Reviewed: 11/20/2010 [facility] Patient Information 2014 [place]

## 2013-09-03 ENCOUNTER — Encounter: Payer: Self-pay | Admitting: Gastroenterology

## 2013-09-03 ENCOUNTER — Ambulatory Visit (INDEPENDENT_AMBULATORY_CARE_PROVIDER_SITE_OTHER): Payer: BC Managed Care – PPO | Admitting: Gastroenterology

## 2013-09-03 VITALS — BP 114/88 | HR 76 | Ht 64.5 in | Wt 151.0 lb

## 2013-09-03 DIAGNOSIS — K5900 Constipation, unspecified: Secondary | ICD-10-CM

## 2013-09-03 DIAGNOSIS — D649 Anemia, unspecified: Secondary | ICD-10-CM

## 2013-09-03 DIAGNOSIS — D509 Iron deficiency anemia, unspecified: Secondary | ICD-10-CM

## 2013-09-03 NOTE — Patient Instructions (Signed)

## 2013-09-03 NOTE — Assessment & Plan Note (Signed)
Patient probably has chronic idiopathic constipation.    Recommendations #1 daily fiber supplementation

## 2013-09-03 NOTE — Addendum Note (Signed)
Addended by: Marlowe KaysSTALLINGS, Spruha Weight M on: 09/03/2013 02:59 PM   Modules accepted: Orders

## 2013-09-03 NOTE — Progress Notes (Signed)
    _                                                                                                                History of Present Illness: 48 year old Hispanic female referred for evaluation of anemia.  She has been told that she was anemic for at least a couple of years.  She takes iron.  In January, 2015 hemoglobin was 11.1.  Serum ferritin level was one.  She tends to be constipated.  She has mild rectal discomfort with a bowel movement and has seen blood coating the stool.  She denies abdominal pain or dysphagia.  She has occasional pyrosis.  She takes Naprosyn intermittently for headaches.  She has intermittent heavy periods.    Past Medical History  Diagnosis Date  . Anemia    History reviewed. No pertinent past surgical history. family history includes Cancer in her mother; Hypertension in her father and mother. Current Outpatient Prescriptions  Medication Sig Dispense Refill  . ferrous sulfate 325 (65 FE) MG tablet Take 325 mg by mouth daily with breakfast.      . ibuprofen (ADVIL,MOTRIN) 200 MG tablet Take 200 mg by mouth every 6 (six) hours as needed.       No current facility-administered medications for this visit.   Allergies as of 09/03/2013  . (No Known Allergies)    reports that she has never smoked. She has never used smokeless tobacco. She reports that she does not drink alcohol or use illicit drugs.     Review of Systems: Pertinent positive and negative review of systems were noted in the above HPI section. All other review of systems were otherwise negative.  Vital signs were reviewed in today's medical record Physical Exam: General: Well developed , well nourished, no acute distress Skin: anicteric Head: Normocephalic and atraumatic Eyes:  sclerae anicteric, EOMI Ears: Normal auditory acuity Mouth: No deformity or lesions Neck: Supple, no masses or thyromegaly Lungs: Clear throughout to auscultation Heart: Regular rate and rhythm; no  murmurs, rubs or bruits Abdomen: Soft, non tender and non distended. No masses, hepatosplenomegaly or hernias noted. Normal Bowel sounds Rectal: There are no external abnormalities Musculoskeletal: Symmetrical with no gross deformities  Skin: No lesions on visible extremities Pulses:  Normal pulses noted Extremities: No clubbing, cyanosis, edema or deformities noted Neurological: Alert oriented x 4, grossly nonfocal Cervical Nodes:  No significant cervical adenopathy Inguinal Nodes: No significant inguinal adenopathy Psychological:  Alert and cooperative. Normal mood and affect  See Assessment and Plan under Problem List

## 2013-09-03 NOTE — Assessment & Plan Note (Signed)
She clearly has an iron deficiency anemia.  This could be do to combination menstrual blood loss and possibly chronic GI blood loss.  Limited rectal bleeding may be related to hemorrhoids although a more proximal colonic bleeding source should be ruled out.  Recommendations #1 Hemoccults #2 colonoscopy

## 2013-09-05 ENCOUNTER — Encounter: Payer: Self-pay | Admitting: Gastroenterology

## 2013-09-24 ENCOUNTER — Other Ambulatory Visit: Payer: Self-pay | Admitting: *Deleted

## 2013-10-01 ENCOUNTER — Other Ambulatory Visit: Payer: Self-pay

## 2013-10-01 ENCOUNTER — Ambulatory Visit (AMBULATORY_SURGERY_CENTER): Payer: BC Managed Care – PPO | Admitting: Gastroenterology

## 2013-10-01 ENCOUNTER — Encounter: Payer: Self-pay | Admitting: Gastroenterology

## 2013-10-01 VITALS — BP 143/79 | HR 60 | Temp 97.0°F | Resp 20 | Ht 64.5 in | Wt 151.0 lb

## 2013-10-01 DIAGNOSIS — D649 Anemia, unspecified: Secondary | ICD-10-CM

## 2013-10-01 MED ORDER — SODIUM CHLORIDE 0.9 % IV SOLN: 500.0000 mL | INTRAVENOUS | Status: DC

## 2013-10-01 NOTE — Patient Instructions (Signed)
YOU HAD AN ENDOSCOPIC PROCEDURE TODAY AT THE [facility]: Refer to the procedure report that was given to you for any specific questions about what was found during the examination.  If the procedure report does not answer your questions, please call your gastroenterologist to clarify.  If you requested that your care partner not be given the details of your procedure findings, then the procedure report has been included in a sealed envelope for you to review at your convenience later.  YOU SHOULD EXPECT: Some feelings of bloating in the abdomen. Passage of more gas than usual.  Walking can help get rid of the air that was put into your GI tract during the procedure and reduce the bloating. If you had a lower endoscopy (such as a colonoscopy or flexible sigmoidoscopy) you may notice spotting of blood in your stool or on the toilet paper. If you underwent a bowel prep for your procedure, then you may not have a normal bowel movement for a few days.  DIET: Your first meal following the procedure should be a light meal and then it is ok to progress to your normal diet.  A half-sandwich or bowl of soup is an example of a good first meal.  Heavy or fried foods are harder to digest and may make you feel nauseous or bloated.  Likewise meals heavy in dairy and vegetables can cause extra gas to form and this can also increase the bloating.  Drink plenty of fluids but you should avoid alcoholic beverages for 24 hours.  ACTIVITY: Your care partner should take you home directly after the procedure.  You should plan to take it easy, moving slowly for the rest of the day.  You can resume normal activity the day after the procedure however you should NOT DRIVE or use heavy machinery for 24 hours (because of the sedation medicines used during the test).    SYMPTOMS TO REPORT IMMEDIATELY: A gastroenterologist can be reached at any hour.  During normal business hours, 8:30 AM to 5:00 PM Monday through Friday,  call [phone].  After hours and on weekends, please call the GI answering service at [phone] who will take a message and have the physician on call contact you.   Following lower endoscopy (colonoscopy or flexible sigmoidoscopy):  Excessive amounts of blood in the stool  Significant tenderness or worsening of abdominal pains  Swelling of the abdomen that is new, acute  Fever of 100F or higher    FOLLOW UP: If any biopsies were taken you will be contacted by phone or by letter within the next 1-3 weeks.  Call your gastroenterologist if you have not heard about the biopsies in 3 weeks.  Our staff will call the home number listed on your records the next business day following your procedure to check on you and address any questions or concerns that you may have at that time regarding the information given to you following your procedure. This is a courtesy call and so if there is no answer at the home number and we have not heard from you through the emergency physician on call, we will assume that you have returned to your regular daily activities without incident.  SIGNATURES/CONFIDENTIALITY: You and/or your care partner have signed paperwork which will be entered into your electronic medical record.  These signatures attest to the fact that that the information above on your After Visit Summary has been reviewed and is understood.  Full responsibility of the confidentiality   of this discharge information lies with you and/or your care-partner.     You may resume your current medications today. Recall colon in 10 years. Repeat the hemeoccult cards in 7 - 10 day.  May 4th - May 13th. Please call if any questions or concerns.

## 2013-10-01 NOTE — Progress Notes (Signed)
No complaints noted in the recovery room. maw 

## 2013-10-01 NOTE — Progress Notes (Signed)
Procedure ends, to recovery, report given and VSS. 

## 2013-10-01 NOTE — Op Note (Signed)
[facility] [address] [place], [id number]   COLONOSCOPY PROCEDURE REPORT  PATIENT: Ariana [name], [name]  MR#: [id number] BIRTHDATE: May 23, 1966 , 47  yrs. old GENDER: Female ENDOSCOPIST: Louis Meckelobert D Jhalil Silvera, MD REFERRED XL:KGMWNBY:Chris Guest, M.D. PROCEDURE DATE:  10/01/2013 PROCEDURE:   Colonoscopy, diagnostic First Screening Colonoscopy - Avg.  risk and is 50 yrs.  old or older Yes.  Prior Negative Screening - Now for repeat screening. N/A  History of Adenoma - Now for follow-up colonoscopy & has been > or = to 3 yrs.  N/A  Polyps Removed Today? No.  Recommend repeat exam, <10 yrs? No. ASA CLASS:   Class II INDICATIONS:Iron Deficiency Anemia, heme negative stool, menorrhagia. MEDICATIONS: MAC sedation, administered by CRNA and propofol (Diprivan) 300mg  IV  DESCRIPTION OF PROCEDURE:   After the risks benefits and alternatives of the procedure were thoroughly explained, informed consent was obtained.  A digital rectal exam revealed no abnormalities of the rectum.   The LB [id number]  endoscope was introduced through the anus and advanced to the terminal ileum which was intubated for a short distance. No adverse events experienced.   The quality of the prep was excellent using Suprep The instrument was then slowly withdrawn as the colon was fully examined.      COLON FINDINGS: The mucosa appeared normal in the terminal ileum. A normal appearing cecum, ileocecal valve, and appendiceal orifice were identified.  The ascending, hepatic flexure, transverse, splenic flexure, descending, sigmoid colon and rectum appeared unremarkable.  No polyps or cancers were seen.  Retroflexed views revealed no abnormalities. The time to cecum=3 minutes 14 seconds. Withdrawal time=10 minutes 20 seconds.  The scope was withdrawn and the procedure completed. COMPLICATIONS: There were no complications.  ENDOSCOPIC IMPRESSION: 1.   Normal mucosa in the terminal ileum 2.   Normal  colon  RECOMMENDATIONS: Colonoscopy 10 years repeat hemeoccults 7-10 days   eSigned:  Louis Meckelobert D Alekxander Isola, MD 10/01/2013 3:47 PM   cc:   PATIENT NAME:  Ariana [name], Elna MR#: [id number]

## 2013-10-02 ENCOUNTER — Telehealth: Payer: Self-pay

## 2013-10-02 NOTE — Telephone Encounter (Signed)
  Follow up Call-  Call back number 10/01/2013  Post procedure Call Back phone  # [phone]  Permission to leave phone message Yes     Patient questions:  Do you have a fever, pain , or abdominal swelling? no Pain Score  0 *  Have you tolerated food without any problems? yes  Have you been able to return to your normal activities? yes  Do you have any questions about your discharge instructions: Diet   no Medications  no Follow up visit  no  Do you have questions or concerns about your Care? no  Actions: * If pain score is 4 or above: No action needed, pain <4.   No problems per the pt. maw

## 2014-04-30 ENCOUNTER — Other Ambulatory Visit: Payer: Self-pay

## 2014-04-30 DIAGNOSIS — Z1231 Encounter for screening mammogram for malignant neoplasm of breast: Secondary | ICD-10-CM

## 2014-05-15 ENCOUNTER — Ambulatory Visit
Admission: RE | Admit: 2014-05-15 | Discharge: 2014-05-15 | Disposition: A | Discharge status: Home / Self Care | Payer: BC Managed Care – PPO | Source: Ambulatory Visit

## 2014-05-15 DIAGNOSIS — Z1231 Encounter for screening mammogram for malignant neoplasm of breast: Secondary | ICD-10-CM

## 2014-07-26 ENCOUNTER — Telehealth: Payer: Self-pay

## 2014-07-26 NOTE — Telephone Encounter (Signed)
Pt called saying someone from here was trying to call her. Please advise [phone]. I couldn't figure out what she was talking about.

## 2014-10-20 ENCOUNTER — Ambulatory Visit (INDEPENDENT_AMBULATORY_CARE_PROVIDER_SITE_OTHER): Payer: BLUE CROSS/BLUE SHIELD | Admitting: Emergency Medicine

## 2014-10-20 VITALS — BP 128/86 | HR 65 | Temp 97.7°F | Resp 17 | Ht 65.0 in | Wt 164.0 lb

## 2014-10-20 DIAGNOSIS — M25562 Pain in left knee: Secondary | ICD-10-CM

## 2014-10-20 DIAGNOSIS — M25561 Pain in right knee: Secondary | ICD-10-CM | POA: Diagnosis not present

## 2014-10-20 MED ORDER — MELOXICAM 15 MG PO TABS: 15.0000 mg | ORAL_TABLET | Freq: Every day | ORAL | Status: DC

## 2014-10-20 MED ORDER — GLUCOSAMINE-CHONDROITIN 500-400 MG PO TABS: 1.0000 | ORAL_TABLET | Freq: Three times a day (TID) | ORAL | Status: DC

## 2014-10-20 NOTE — Patient Instructions (Signed)
Dolor en la rodilla °(Knee Pain) °La rodilla es la articulación compleja entre el muslo y la parte inferior de la pierna. En esta articulación hay huesos, tendones, ligamentos y cartílago. Los huesos que forman la rodilla son: °· El fémur en el muslo. °· La tibia y el peroné en la pierna. °· La rótula montada en la ranura de la parte inferior del muslo. °CAUSAS °El dolor de rodilla es una causa frecuente de queja y puede tener varias causas. Algunas son: °· Lesiones como: °¨ Ruptura de ligamento o lesión en el tendón. °¨ Esguince del cartílago °· Enfermedades como: °¨ Gota. °¨ Artritis. °¨ Infecciones. °· Uso excesivo, demasiado entrenamiento o mucha actividad física. °El dolor de rodilla puede ser leve o intenso. Puede acompañar una lesión debilitante. Los problemas leves con frecuencia responden bien a tratamientos caseros o se mejoran por sí mismas. Las lesiones más graves pueden requerir la intervención del médico y hasta una cirugía. °SÍNTOMAS °La rodilla es una articulación compleja. Los síntomas pueden variar ampliamente Algunos son: °· Dolor con el movimiento o al soportar peso. °· Hinchazón y dolor. °· Torsión de la rodilla. °· Imposibilidad para estirar la rodilla. °· La rodilla se traba y no puede enderezarla. °· Siente calor y se observa enrojecimiento con dolor y fiebre. °· Deformidad o dislocación de la rótula. °DIAGNÓSTICO °Determinar cual es el problema puede ser bastante simple, como cuando hay una lesión. También puede ser complicado debido a la complejidad de la rodilla. Las pruebas para realizar un diagnóstico son: °· Historia clínica y examen físico por parte del médico. °· Radiografías para descartar otros problemas. Las radiografías no mostrarán la ruptura del cartílago. Algunas lesiones en la rodilla pueden diagnosticarse del siguiente modo: °¨ La artroscopia es una técnica quirúrgica por la que una pequeña cámara de vídeo se inserta en pequeñas incisiones que se hacen a los lados de la  rodilla. Este procedimiento se utiliza para examinar y reparar los problemas de la articulación interna de la rodilla. Se utilizan pequeños instrumentos para reparar el cartílago roto (meniscos). °¨ La artrografía es una técnica radiológica. Se inyecta un líquido de contraste en la articulación de la rodilla. Las estructuras internas de la articulación de la rodilla se hacen visibles en una película de rayos X. °¨ Las imágenes por resonancia magnética son un procedimiento en el que los campos magnéticos y una computadora producen imágenes en dos o tres dimensiones del interior de la rodilla. La ruptura del cartílago es visible con esta técnica. La resonancia magnética ha reemplazado a la artrografía en el diagnóstico de la ruptura del cartílago de la rodilla. °· Análisis de sangre. °· Examen del líquido que lubrica la articulación de la rodilla (líquido sinovial). Se realiza tomando una muestra con una aguja o jeringa. °TRATAMIENTO °El tratamiento de los problemas de la rodilla depende fundamentalmente de la causa. Algunos de estos tratamientos son: °· Según sea la lesión, un yeso o entablillado, cirugía o fisioterapia. °· Permítase el tiempo adecuado de recuperación. No use demasiado su extremidad lesionada. Si siente dolor durante los ejercicios de rutina, suspéndalos. Hágalos más lentos o realice menos repeticiones. °· En el caso de actividades repetitivas como andar en bicicleta o correr, mantenga la fuerza y una buena nutrición. °· Alterne los grupos musculares. Por ejemplo, si levanta pesas, trabaje la parte superior del cuerpo un día, y la parte inferior al día siguiente. °· Ni los músculos firmes ni los débiles proporcionan un sostén adecuado a la rodilla. Los músculos no absorben el estrés que   se ejerce sobre la articulación de la rodilla. Mantenga fuertes los músculos que rodean a la rodilla. °· Cuídese de los problemas mecánicos: °¨ Si tiene pie plano, los zapatos ortopédicos o especiales pueden ayudar.  Comuníquese con el profesional que lo asiste si necesita ayuda adicional. °¨ Los soporte de arco con bordes en la zona interna o interna del taco pueden ayudar. Cambian la presión del lado de la rodilla más comprometido por la osteoartritis. °¨ Podrán colocarle una ortesis de rodilla para aliviar la presión en la zona más artrítica de la rodilla. °· Si el profesional le ha prescripto muletas, ortesis, un vendaje o hielo, hágalo según las indicaciones. El acrónimo para este tratamiento es PRICE. Significa protección, reposo, hielo, compresión y elevación. °· Los antiinflamatorios no esteroides, pueden ayudar a aliviar el dolor. Pero si se toman inmediatamente luego de la lesión, podrían aumentar la hinchazón. Tome los corticoides luego de comer algo. Suspéndalos si tiene problemas estomacales. No los tome si tiene una historia de úlcera, dolor en el estómago o hemorragia intestinal. No lo tome sin la aprobación del profesional que la asiste si tiene problemas de retención de líquidos, insuficencia cardíaca o problemas renales. °· En los casos crónicos, la fisioterapia puede ser de ayuda. °· La glucosamina y el condroitin son suplementos dietarios de venta libre. Ambos pueden aliviar el dolor de la osteoartritis de la rodilla. Estos medicamentos son diferentes de los antiinflamatorios habituales. La glucosamina puede disminuir el porcentaje de destrucción del cartílago. °· Las inyecciones de corticoides en la articulación de la rodilla reducen los síntomas de un brote de artritis. Ofrecen alivio que dura algunos meses. Hay que esperar algunos meses entre la aplicación de inyecciones. Las inyecciones tiene un pequeño riesgo de infección, retención de líquidos y aumento de los niveles de azúcar en sangre. °· El ácido hialurónico inyectado en las articulaciones lesionadas puede aliviar el dolor y proporciona lubricación. Estas inyecciones funcionan bien reduciendo la inflamación. Una serie de inyecciones puede  proporcionar alivio durante seis meses. °· Analgésicos locales. Aplicar ciertos ungüentos sobre la piel puede ayudar a aliviar el dolor y la rigidez de la osteoartritis. Consulte con el farmacéutico, si es necesario. Muchos medicamentos de venta libre están aprobados para el alivio temporario del dolor artrítico. °· En algunos países los médicos prescriben antiinflamatorios no esteroides para el alivio de los trastornos crónicos como la artritis y la tendinitis. Un estudio del tratamiento con antiinflamatorios no esteroides aplicados en crema, demostró que funcionaban bien, así como administrados por vía oral, pero sin el peligro de los efectos adversos. °PREVENCIÓN °· Mantenga un peso normal. Los kilos de más agregan tensión a las articulaciones. °· Manténgase fuerte y ágil. Los músculos débiles son una causa frecuente de lesiones en la rodilla. La elongación es importante. Incluya ejercicios de flexibilidad en sus rutinas. °· Practique actividad física con inteligencia. Si sufre osteoartritis, dolor crónico en la rodilla o lesiones recurrentes, podrá ser necesario que modifique el modo en que se ejercita. No significa que deba volverse inactivo. Si le duelen las rodillas después de correr o jugar basketball, considere la práctica de la natación, ejercicios aeróbicos en el agua u otras actividades de bajo impacto, al menos durante algunos días o algunas semanas. En algunos casos, el limitar las actividades de alto impacto ofrece alivio. °· Asegúrese que sus zapatos le ajustan bien. Elija el calzado deportivo adecuado para su deporte. °· Proteja sus rodillas. Use la protección adecuada para las actividades que puedan afectar a sus rodillas. Use rodilleras cuando juegue al   vóley o se arrodille. Colóquese el cinturón de seguridad cada vez que conduzca. La mayor parte de las fracturas de rótula ocurren en accidentes automóvilísticos. °· Descanse cuando se sienta cansado. °SOLICITE ATENCIÓN MÉDICA SI: °Tiene dolor en la  rodilla que es continuo y no parece mejorar.  °SOLICITE ATENCIÓN MÉDICA DE INMEDIATO SI:  °La articulación de la rodilla se siente caliente al tacto y usted tiene fiebre. °ESTÉ SEGURO QUE:  °· Comprende las instrucciones para el alta médica. °· Controlará su enfermedad. °· Solicitará atención médica de inmediato según las indicaciones. °Document Released: 11/10/2007 Document Revised: 08/16/2011 °ExitCare® Patient Information ©2015 [employer]. This information is not intended to replace advice given to you by your health care provider. Make sure you discuss any questions you have with your health care provider. ° °

## 2014-10-20 NOTE — Progress Notes (Signed)
[facility] [address], [place] [id number] [phone]  Date:  10/20/2014   Name:  Ariana ReichmannSilvia [name]   DOB:  11-13-1965   MRN:  [id number]  PCP:  Tally DueGUEST, CHRIS WARREN, MD    Chief Complaint: Knee Pain and Fatigue   History of Present Illness:  Ariana ReichmannSilvia Biglow is a 49 y.o. 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 No associated injury or overuse No swelling or ecchymosis.  No tenderness Worse when stands and bends.  Better when lays down No improvement with over the counter medications or other home remedies. Denies other complaint or health concern today.   Patient Active Problem List   Diagnosis Date Noted  . Iron deficiency anemia, unspecified 09/03/2013  . Unspecified constipation 09/03/2013    Past Medical History  Diagnosis Date  . Anemia     History reviewed. No pertinent past surgical history.  History  Substance Use Topics  . Smoking status: Never Smoker   . Smokeless tobacco: Never Used  . Alcohol Use: No    Family History  Problem Relation Age of Onset  . Cancer Mother   . Hypertension Mother   . Hypertension Father   . Colon cancer Neg Hx   . Esophageal cancer Neg Hx   . Rectal cancer Neg Hx   . Stomach cancer Neg Hx     No Known Allergies  Medication list has been reviewed and updated.  Current Outpatient Prescriptions on File Prior to Visit  Medication Sig Dispense Refill  . ferrous sulfate 325 (65 FE) MG tablet Take 325 mg by mouth daily with breakfast.    . ibuprofen (ADVIL,MOTRIN) 200 MG tablet Take 200 mg by mouth every 6 (six) hours as needed.     No current facility-administered medications on file prior to visit.    Review of Systems:  Review of Systems  Constitutional: Negative for fever, chills and fatigue.   HENT: Negative for congestion, ear pain, hearing loss, postnasal drip, rhinorrhea and sinus pressure.   Eyes: Negative for discharge and redness.  Respiratory: Negative for cough, shortness of breath and wheezing.   Cardiovascular: Negative for chest pain and leg swelling.  Gastrointestinal: Negative for nausea, vomiting, abdominal pain, constipation and blood in stool.  Genitourinary: Negative for dysuria, urgency and frequency.  Musculoskeletal: Negative for neck stiffness.  Skin: Negative for rash.  Neurological: Negative for seizures, weakness and headaches.   Physical Examination: Filed Vitals:   10/20/14 1041  BP: 128/86  Pulse: 65  Temp: 97.7 F (36.5 C)  Resp: 17   Filed Vitals:   10/20/14 1041  Height: 5\' 5"  (1.651 m)  Weight: 164 lb (74.39 kg)   Body mass index is 27.29 kg/(m^2). Ideal Body Weight: Weight in (lb) to have BMI = 25: 149.9    GEN: WDWN, NAD, Non-toxic, Alert & Oriented x 3 HEENT: Atraumatic, Normocephalic.  Ears and Nose: No external deformity. EXTR: No clubbing/cyanosis/edema NEURO: Normal gait.  PSYCH: Normally interactive. Conversant. Not depressed or anxious appearing.  Calm demeanor.  KNEES:  Bilaterally no effusion or ecchymosis.  Joint stable.  Full A&PROM. Assessment and Plan: 1. Arthralgia of both knees  local heat If no improvement consider imaging and labs - Calcium Carbonate-Vitamin D (CALCIUM + D PO); Take by mouth. - meloxicam (  MOBIC) 15 MG tablet; Take 1 tablet (15 mg total) by mouth daily.  Dispense: 30 tablet; Refill: 1 - glucosamine-chondroitin (MAX GLUCOSAMINE CHONDROITIN) 500-400 MG tablet; Take 1 tablet by mouth 3 (three) times daily.  Dispense: 90 tablet; Refill: 5    Signed Phillips OdorJeffery Anaiza Behrens, MD

## 2015-05-14 ENCOUNTER — Other Ambulatory Visit: Payer: Self-pay

## 2015-05-14 DIAGNOSIS — Z1231 Encounter for screening mammogram for malignant neoplasm of breast: Secondary | ICD-10-CM

## 2015-05-19 ENCOUNTER — Ambulatory Visit (INDEPENDENT_AMBULATORY_CARE_PROVIDER_SITE_OTHER): Payer: BLUE CROSS/BLUE SHIELD | Admitting: Physician Assistant

## 2015-05-19 VITALS — BP 118/80 | HR 73 | Temp 97.7°F | Resp 18 | Ht 65.0 in | Wt 161.0 lb

## 2015-05-19 DIAGNOSIS — T887XXA Unspecified adverse effect of drug or medicament, initial encounter: Secondary | ICD-10-CM

## 2015-05-19 DIAGNOSIS — T50905A Adverse effect of unspecified drugs, medicaments and biological substances, initial encounter: Secondary | ICD-10-CM

## 2015-05-19 DIAGNOSIS — D509 Iron deficiency anemia, unspecified: Secondary | ICD-10-CM

## 2015-05-19 DIAGNOSIS — Z113 Encounter for screening for infections with a predominantly sexual mode of transmission: Secondary | ICD-10-CM | POA: Diagnosis not present

## 2015-05-19 DIAGNOSIS — N898 Other specified noninflammatory disorders of vagina: Secondary | ICD-10-CM

## 2015-05-19 DIAGNOSIS — H60391 Other infective otitis externa, right ear: Secondary | ICD-10-CM | POA: Diagnosis not present

## 2015-05-19 DIAGNOSIS — Z124 Encounter for screening for malignant neoplasm of cervix: Secondary | ICD-10-CM

## 2015-05-19 DIAGNOSIS — B351 Tinea unguium: Secondary | ICD-10-CM

## 2015-05-19 LAB — CBC
HCT: 42.7 % (ref 36.0–46.0)
Hemoglobin: 14.3 g/dL (ref 12.0–15.0)
MCH: 29.6 pg (ref 26.0–34.0)
MCHC: 33.5 g/dL (ref 30.0–36.0)
MCV: 88.4 fL (ref 78.0–100.0)
MPV: 10.3 fL (ref 8.6–12.4)
PLATELETS: 306 10*3/uL (ref 150–400)
RBC: 4.83 MIL/uL (ref 3.87–5.11)
RDW: 13.8 % (ref 11.5–15.5)
WBC: 5.8 10*3/uL (ref 4.0–10.5)

## 2015-05-19 LAB — HEPATIC FUNCTION PANEL
ALT: 20 U/L (ref 6–29)
AST: 17 U/L (ref 10–35)
Albumin: 4.2 g/dL (ref 3.6–5.1)
Alkaline Phosphatase: 66 U/L (ref 33–115)
BILIRUBIN DIRECT: 0.2 mg/dL (ref <=0.2)
Indirect Bilirubin: 0.8 mg/dL (ref 0.2–1.2)
TOTAL PROTEIN: 7.2 g/dL (ref 6.1–8.1)
Total Bilirubin: 1 mg/dL (ref 0.2–1.2)

## 2015-05-19 LAB — POCT WET + KOH PREP
Trich by wet prep: ABSENT
YEAST BY KOH: ABSENT
Yeast by wet prep: ABSENT

## 2015-05-19 MED ORDER — NEOMYCIN-POLYMYXIN-HC 3.5-10000-1 OT SOLN: 4.0000 [drp] | Freq: Three times a day (TID) | OTIC | Status: DC

## 2015-05-19 NOTE — Progress Notes (Signed)
[facility] [address] [place] [address] [phone]  Date:  05/19/2015   Name:  Ariana [name]   DOB:  Jan 24, 1966   MRN:  [id number]  PCP:  Tally Due, MD   Chief Complaint  Patient presents with  . Gynecologic Exam    pap  . Nail Problem    toe nail would like medication  . Ear Pain    rt-on/off x2 weeks     History of Present Illness:  Ariana [name] is a 49 y.o. female patient who presents to [facility] for cc of ear discomfort for 2 weeks, nail issue, and pap exam.  Patient states that she would like routine pap today.  No vaginal issues of abnormal discharge, odor, rash.  Patient has not had period for 3 years.  She states that she has had no menopausal symptoms.  No hot flashes.  3 children, 4 pregnancies--one miscarriage 12 years ago.  All vaginal births.  Last pap was normal, performed by Dr. Chrissie Noa on [address]  Patient reports nail problem for years.  She states they are hardened and deformed.  She has no pain, unless she wears shoes that cause pressure on turned nail tip.  She has tried anti-fungal medication (years ago) for feet which helped a little.  Only effects 1st and 2nd toe.  No itching, no rash.  etOH intake rare.    Complains of ear tickling of right ear.  Started 2 weeks ago, where she had a sharp pain.  This was in [place].  No drainage, UR sxs, or dysequilibrium.  No facial numbness or tingling.  No fever.  Pain is aggravated by blowing her nose, she reports.   Anemia for 1 year found on lab results 06/2013.  She has been on iron supplements since.  Diet attempted, but often forgets to include iron-rich foods.   Patient Active Problem List   Diagnosis Date Noted  . Iron deficiency anemia, unspecified 09/03/2013  . Unspecified constipation 09/03/2013    Past Medical History  Diagnosis Date  . Anemia     History reviewed. No pertinent past surgical history.  Social History  Substance Use Topics  . Smoking  status: Never Smoker   . Smokeless tobacco: Never Used  . Alcohol Use: No    Family History  Problem Relation Age of Onset  . Cancer Mother   . Hypertension Mother   . Hypertension Father   . Colon cancer Neg Hx   . Esophageal cancer Neg Hx   . Rectal cancer Neg Hx   . Stomach cancer Neg Hx     No Known Allergies  Medication list has been reviewed and updated.  Current Outpatient Prescriptions on File Prior to Visit  Medication Sig Dispense Refill  . Calcium Carbonate-Vitamin D (CALCIUM + D PO) Take by mouth.    . ferrous sulfate 325 (65 FE) MG tablet Take 325 mg by mouth daily with breakfast.    . glucosamine-chondroitin (MAX GLUCOSAMINE CHONDROITIN) 500-400 MG tablet Take 1 tablet by mouth 3 (three) times daily. 90 tablet 5  . ibuprofen (ADVIL,MOTRIN) 200 MG tablet Take 200 mg by mouth every 6 (six) hours as needed.    . meloxicam (MOBIC) 15 MG tablet Take 1 tablet (15 mg total) by mouth daily. (Patient not taking: Reported on 05/19/2015) 30 tablet 1   No current facility-administered medications on file prior to visit.    ROS ROS otherwise unremarkable unless listed above.   Physical  Examination: BP 118/80 mmHg  Pulse 73  Temp(Src) 97.7 F (36.5 C) (Oral)  Resp 18  Ht 5\' 5"  (1.651 m)  Wt 161 lb (73.029 kg)  BMI 26.79 kg/m2  SpO2 99% Ideal Body Weight: Weight in (lb) to have BMI = 25: 149.9  Physical Exam  Constitutional: She is oriented to person, place, and time. She appears well-developed and well-nourished. No distress.  HENT:  Head: Normocephalic and atraumatic.  Right Ear: External ear normal.  Left Ear: Tympanic membrane, external ear and ear canal normal.  Right ear with minimal erythema and tenderness at the base of the canal.  TM is normal.  Eyes: Conjunctivae and EOM are normal. Pupils are equal, round, and reactive to light.  Cardiovascular: Normal rate.   Pulmonary/Chest: Effort normal. No respiratory distress.  Genitourinary: Pelvic exam was  performed with patient supine. There is no rash or tenderness on the right labia. There is no rash or tenderness on the left labia. Cervix exhibits discharge (bright yellow discharge from the cervical os.  Transitional-looking changes well visualized along the 9-12 o'clock region of the cervix.) and friability. Cervix exhibits no motion tenderness. Right adnexum displays no mass, no tenderness and no fullness. Left adnexum displays no mass, no tenderness and no fullness. No erythema or bleeding in the vagina. No vaginal discharge found.  Neurological: She is alert and oriented to person, place, and time.  Skin: She is not diaphoretic.  Hypertrophy of the nail bed on both the 1st and 2nd toe bilaterally.  No rash or growth interdigitally.  Psychiatric: She has a normal mood and affect. Her behavior is normal.    Results for orders placed or performed in visit on 05/19/15  POCT Wet + KOH Prep  Result Value Ref Range   Yeast by KOH Absent Present, Absent   Yeast by wet prep Absent Present, Absent   WBC by wet prep Many (A) None, Few, Too numerous to count   Clue Cells Wet Prep HPF POC None None, Too numerous to count   Trich by wet prep Absent Present, Absent   Bacteria Wet Prep HPF POC Many (A) None, Few, Too numerous to count   Epithelial Cells By Principal FinancialWet Pref (UMFC) Moderate (A) None, Few, Too numerous to count   RBC,UR,HPF,POC Few (A) None RBC/hpf    Assessment and Plan: Barbette ReichmannSilvia Liss is a 49 y.o. female who is here today for cc of right ear discomfort, toenail fungus, and a pap smear. --pap smear performed.  Wet prep is untelling.  Advised to cover ct/ng --anemia rechecked at this time. --treating for otitis externa.  Advised to return for alarming sxs (numbness/tingling of face, visual rash, increased ear pain).  Patient voiced understanding --we will start the 12-week course of anti-fungal, PENDING normal hepatic function.    Screening for cervical cancer - Plan: Pap IG, CT/NG w/ reflex  HPV when ASC-U  Screening for STD (sexually transmitted disease) - Plan: Pap IG, CT/NG w/ reflex HPV when ASC-U  Anemia, iron deficiency - Plan: CBC  Medication adverse effect, initial encounter  OM (onychomycosis) - Plan: Hepatic function panel  Otitis, externa, infective, right - Plan: neomycin-polymyxin-hydrocortisone (CORTISPORIN) otic solution  Vaginal discharge - Plan: POCT Wet + KOH Prep    Trena PlattStephanie Toleen Lachapelle, PA-C [facility] 05/19/2015 10:21 AM

## 2015-05-19 NOTE — Patient Instructions (Addendum)
I will have your lab results within the next 7-10 days.  Pending lab results, we will start you on the terbinafine.  This is a 12 week daily course of anti-fungal..   Please take ear drops as prescribed.  If you start to have increased pain, develop rash, numbness or tingling in your face, I would like you to return.

## 2015-05-21 ENCOUNTER — Other Ambulatory Visit: Payer: Self-pay | Admitting: Physician Assistant

## 2015-05-21 DIAGNOSIS — B351 Tinea unguium: Secondary | ICD-10-CM

## 2015-05-21 LAB — PAP IG, CT-NG, RFX HPV ASCU
Chlamydia Probe Amp: NOT DETECTED
GC PROBE AMP: NOT DETECTED

## 2015-05-21 MED ORDER — TERBINAFINE HCL 250 MG PO TABS: 250.0000 mg | ORAL_TABLET | Freq: Every day | ORAL | Status: DC

## 2015-05-26 ENCOUNTER — Ambulatory Visit
Admission: RE | Admit: 2015-05-26 | Discharge: 2015-05-26 | Disposition: A | Discharge status: Home / Self Care | Payer: BLUE CROSS/BLUE SHIELD | Source: Ambulatory Visit

## 2015-05-26 DIAGNOSIS — Z1231 Encounter for screening mammogram for malignant neoplasm of breast: Secondary | ICD-10-CM

## 2015-08-08 ENCOUNTER — Ambulatory Visit: Payer: BLUE CROSS/BLUE SHIELD

## 2015-08-08 ENCOUNTER — Other Ambulatory Visit: Payer: BLUE CROSS/BLUE SHIELD

## 2015-08-08 ENCOUNTER — Ambulatory Visit (INDEPENDENT_AMBULATORY_CARE_PROVIDER_SITE_OTHER): Payer: BLUE CROSS/BLUE SHIELD | Admitting: Physician Assistant

## 2015-08-08 ENCOUNTER — Ambulatory Visit (INDEPENDENT_AMBULATORY_CARE_PROVIDER_SITE_OTHER): Payer: BLUE CROSS/BLUE SHIELD

## 2015-08-08 VITALS — BP 118/72 | HR 73 | Temp 98.0°F | Resp 16 | Ht 65.0 in | Wt 161.0 lb

## 2015-08-08 DIAGNOSIS — M7732 Calcaneal spur, left foot: Secondary | ICD-10-CM | POA: Diagnosis not present

## 2015-08-08 DIAGNOSIS — M25561 Pain in right knee: Secondary | ICD-10-CM | POA: Diagnosis not present

## 2015-08-08 DIAGNOSIS — M79671 Pain in right foot: Secondary | ICD-10-CM

## 2015-08-08 DIAGNOSIS — M25562 Pain in left knee: Secondary | ICD-10-CM

## 2015-08-08 DIAGNOSIS — M79672 Pain in left foot: Secondary | ICD-10-CM

## 2015-08-08 MED ORDER — MELOXICAM 15 MG PO TABS: 15.0000 mg | ORAL_TABLET | Freq: Every day | ORAL | Status: DC

## 2015-08-08 NOTE — Progress Notes (Signed)
[facility] [address], [place] [id number] [phone]  Date:  08/08/2015   Name:  Ariana ReichmannSilvia Shuck   DOB:  08-Mar-1966   MRN:  [id number]  PCP:  Tally DueGUEST, CHRIS WARREN, MD    History of Present Illness:  Ariana [name] is a 50 y.o. female patient [facility] and knee pain of left leg.   Heel started hurting 5-6 days ago.  She walks a lot of work.  2 days ago, knee pain feels swollen, not warm or redness.  Pain is centrally located and agrravated by bending.  She was walking, and turned to pivot, she then felt a sharp knee pain.   She wears steel toe boots.  No cushioning within the boot steel toe water boots.  Boots are 4 months.   She took ibuprofnen which helped some.   Patient Active Problem List   Diagnosis Date Noted  . Iron deficiency anemia, unspecified 09/03/2013  . Unspecified constipation 09/03/2013    Past Medical History  Diagnosis Date  . Anemia     History reviewed. No pertinent past surgical history.  Social History  Substance Use Topics  . Smoking status: Never Smoker   . Smokeless tobacco: Never Used  . Alcohol Use: No    Family History  Problem Relation Age of Onset  . Cancer Mother   . Hypertension Mother   . Hypertension Father   . Colon cancer Neg Hx   . Esophageal cancer Neg Hx   . Rectal cancer Neg Hx   . Stomach cancer Neg Hx     No Known Allergies  Medication list has been reviewed and updated.  Current Outpatient Prescriptions on File Prior to Visit  Medication Sig Dispense Refill  . Calcium Carbonate-Vitamin D (CALCIUM + D PO) Take by mouth. Reported on 08/08/2015    . ferrous sulfate 325 (65 FE) MG tablet Take 325 mg by mouth daily with breakfast. Reported on 08/08/2015    . glucosamine-chondroitin (MAX GLUCOSAMINE CHONDROITIN) 500-400 MG tablet Take 1 tablet by mouth 3 (three) times daily. (Patient not taking: Reported on 08/08/2015) 90 tablet 5  . ibuprofen (ADVIL,MOTRIN) 200 MG tablet Take 200 mg  by mouth every 6 (six) hours as needed. Reported on 08/08/2015    . meloxicam (MOBIC) 15 MG tablet Take 1 tablet (15 mg total) by mouth daily. (Patient not taking: Reported on 05/19/2015) 30 tablet 1  . neomycin-polymyxin-hydrocortisone (CORTISPORIN) otic solution Place 4 drops into the right ear 3 (three) times daily. (Patient not taking: Reported on 08/08/2015) 10 mL 0  . terbinafine (LAMISIL) 250 MG tablet Take 1 tablet (250 mg total) by mouth daily. (Patient not taking: Reported on 08/08/2015) 45 tablet 0   No current facility-administered medications on file prior to visit.    ROS ROS otherwise unremarkable unless listed above.  Physical Examination: BP 118/72 mmHg  Pulse 73  Temp(Src) 98 F (36.7 C) (Oral)  Resp 16  Ht 5\' 5"  (1.651 m)  Wt 161 lb (73.029 kg)  BMI 26.79 kg/m2  SpO2 97% Ideal Body Weight: Weight in (lb) to have BMI = 25: 149.9  Physical Exam  Constitutional: She is oriented to person, place, and time. She appears well-developed and well-nourished. No distress.  HENT:  Head: Normocephalic and atraumatic.  Right Ear: External ear normal.  Left Ear: External ear normal.  Eyes: Conjunctivae and EOM are normal. Pupils are equal, round, and reactive to light.  Cardiovascular: Normal rate.  Pulmonary/Chest: Effort normal. No respiratory distress.  Musculoskeletal:       Left knee: She exhibits normal range of motion, no swelling, no erythema, no LCL laxity, normal patellar mobility and no MCL laxity. Tenderness found. Lateral joint line tenderness noted. No patellar tendon tenderness noted.       Left ankle: Tenderness (tender at the distal heel with palpation.  ). No lateral malleolus, no medial malleolus, no head of 5th metatarsal and no proximal fibula tenderness found. Achilles tendon normal.  Neurological: She is alert and oriented to person, place, and time.  Skin: She is not diaphoretic.  Psychiatric: She has a normal mood and affect. Her behavior is normal.   Dg  Knee 1-2 Views Right  08/08/2015  CLINICAL DATA:  Pain for 5 days.  No known injury EXAM: RIGHT KNEE - 1-2 VIEW COMPARISON:  None. FINDINGS: Frontal and lateral views were obtained. There is no demonstrable fracture or dislocation. The joint spaces appear normal. No erosive change. IMPRESSION: No fracture or effusion.  No appreciable arthropathy. Electronically Signed   By: Bretta Bang III M.D.   On: 08/08/2015 11:28   Dg Os Calcis Right  08/08/2015  CLINICAL DATA:  Right heel pain without known injury EXAM: RIGHT OS CALCIS - 2+ VIEW COMPARISON:  None in PACs FINDINGS: The calcaneus is adequately mineralized. There are moderate-sized plantar and Achilles region spurs. The talocalcaneal in a PICC articulation appears normal. The soft tissues of the heel exhibit no abnormal calcifications. IMPRESSION: There are prominent Achilles region and plantar calcaneal spurs. There is no acute bony abnormality of the calcaneus. Electronically Signed   By: David  [place] M.D.   On: 08/08/2015 11:28     Assessment and Plan: Ariana [name] is a 50 y.o. female who is here today for knee and heel pain. Advised anti-inflammatory and ice.  Advised to have cushioning in her shoes.   Advised use of heel cups.  Heel pain, left - Plan: meloxicam (MOBIC) 15 MG tablet, CANCELED: DG Os Calcis Left  Knee pain, acute, left - Plan: meloxicam (MOBIC) 15 MG tablet, CANCELED: DG Knee 1-2 Views Left  Heel pain, right - Plan: DG Os Calcis Right  Knee pain, acute, right - Plan: DG Knee 1-2 Views Right  Heel spur, left - Plan: meloxicam (MOBIC) 15 MG tablet    Trena Platt, PA-C [facility] 08/08/2015 10:37 AM

## 2015-08-08 NOTE — Patient Instructions (Addendum)
Because you received an x-ray today, you will receive an invoice from [facility]. Please contact [facility] at [phone] with questions or concerns regarding your invoice. Our billing staff will not be able to assist you with those questions. Please ice the knee and heel three times per day for 15 minutes.   I would like you to pick up a heel cup (these are soft and sometimes hard).  Please perform these knee exercises as instructed as well as foot stretches.  If you continue to have symptoms, contact me, and we can consider transferring you to ortho.    Heel Spur A heel spur is a bony growth that forms on the bottom of your heel bone (calcaneus). Heel spurs are common and do not always cause pain. However, heel spurs often cause inflammation in the strong band of tissue that runs underneath the bone of your foot (plantar fascia). When this happens, you may feel pain on the bottom of your foot, near your heel.  CAUSES  The cause of heel spurs is not completely understood. They may be caused by pressure on the heel. Or, they may stem from the muscle attachments (tendons) near the spur pulling on the heel.  RISK FACTORS You may be at risk for a heel spur if you:  Are older than 40.  Are overweight.  Have wear and tear arthritis (osteoarthritis).  Have plantar fascia inflammation. SIGNS AND SYMPTOMS  Some people have heel spurs but no symptoms. If you do have symptoms, they may include:   Pain in the bottom of your heel.  Pain that is worse when you first get out of bed.  Pain that gets worse after walking or standing. DIAGNOSIS  Your health care provider may diagnose a heel spur based on your symptoms and a physical exam. You may also have an X-ray of your foot to check for a bony growth coming from the calcaneus.  TREATMENT Treatment aims to relieve the pain from the heel spur. This may include:  Stretching exercises.  Losing weight.  Wearing specific shoes,  inserts, or orthotics for comfort and support.  Wearing splints at night to properly position your feet.  Taking over-the-counter medicine to relieve pain.  Being treated with high-intensity sound waves to break up the heel spur (extracorporeal shock wave therapy).  Getting steroid injections in your heel to reduce swelling and ease pain.  Having surgery if your heel spur causes long-term (chronic) pain. HOME CARE INSTRUCTIONS   Take medicines only as directed by your health care provider.  Ask your health care provider if you should use ice or cold packs on the painful areas of your heel or foot.  Avoid activities that cause you pain until you recover or as directed by your health care provider.  Stretch before exercising or being physically active.  Wear supportive shoes that fit well as directed by your health care provider. You might need to buy new shoes. Wearing old shoes or shoes that do not fit correctly may not provide the support that you need.  Lose weight if your health care provider thinks you should. This can relieve pressure on your foot that may be causing pain and discomfort. SEEK MEDICAL CARE IF:   Your pain continues or gets worse.   This information is not intended to replace advice given to you by your health care provider. Make sure you discuss any questions you have with your health care provider.  Generic Knee Exercises EXERCISES RANGE OF MOTION (  ROM) AND STRETCHING EXERCISES These exercises may help you when beginning to rehabilitate your injury. Your symptoms may resolve with or without further involvement from your physician, physical therapist, or athletic trainer. While completing these exercises, remember:   Restoring tissue flexibility helps normal motion to return to the joints. This allows healthier, less painful movement and activity.  An effective stretch should be held for at least 30 seconds.  A stretch should never be painful. You should  only feel a gentle lengthening or release in the stretched tissue. STRETCH - Knee Extension, Prone  Lie on your stomach on a firm surface, such as a bed or countertop. Place your right / left knee and leg just beyond the edge of the surface. You may wish to place a towel under the far end of your right / left thigh for comfort.  Relax your leg muscles and allow gravity to straighten your knee. Your clinician may advise you to add an ankle weight if more resistance is helpful for you.  You should feel a stretch in the back of your right / left knee. Hold this position for __________ seconds. Repeat __________ times. Complete this stretch __________ times per day. * Your physician, physical therapist, or athletic trainer may ask you to add ankle weight to enhance your stretch.  RANGE OF MOTION - Knee Flexion, Active  Lie on your back with both knees straight. (If this causes back discomfort, bend your opposite knee, placing your foot flat on the floor.)  Slowly slide your heel back toward your buttocks until you feel a gentle stretch in the front of your knee or thigh.  Hold for __________ seconds. Slowly slide your heel back to the starting position. Repeat __________ times. Complete this exercise __________ times per day.  STRETCH - Quadriceps, Prone   Lie on your stomach on a firm surface, such as a bed or padded floor.  Bend your right / left knee and grasp your ankle. If you are unable to reach your ankle or pant leg, use a belt around your foot to lengthen your reach.  Gently pull your heel toward your buttocks. Your knee should not slide out to the side. You should feel a stretch in the front of your thigh and/or knee.  Hold this position for __________ seconds. Repeat __________ times. Complete this stretch __________ times per day.  STRETCH - Hamstrings, Supine   Lie on your back. Loop a belt or towel over the ball of your right / left foot.  Straighten your right / left knee  and slowly pull on the belt to raise your leg. Do not allow the right / left knee to bend. Keep your opposite leg flat on the floor.  Raise the leg until you feel a gentle stretch behind your right / left knee or thigh. Hold this position for __________ seconds. Repeat __________ times. Complete this stretch __________ times per day.  STRENGTHENING EXERCISES These exercises may help you when beginning to rehabilitate your injury. They may resolve your symptoms with or without further involvement from your physician, physical therapist, or athletic trainer. While completing these exercises, remember:   Muscles can gain both the endurance and the strength needed for everyday activities through controlled exercises.  Complete these exercises as instructed by your physician, physical therapist, or athletic trainer. Progress the resistance and repetitions only as guided.  You may experience muscle soreness or fatigue, but the pain or discomfort you are trying to eliminate should never worsen during these  exercises. If this pain does worsen, stop and make certain you are following the directions exactly. If the pain is still present after adjustments, discontinue the exercise until you can discuss the trouble with your clinician. STRENGTH - Quadriceps, Isometrics  Lie on your back with your right / left leg extended and your opposite knee bent.  Gradually tense the muscles in the front of your right / left thigh. You should see either your knee cap slide up toward your hip or increased dimpling just above the knee. This motion will push the back of the knee down toward the floor/mat/bed on which you are lying.  Hold the muscle as tight as you can without increasing your pain for __________ seconds.  Relax the muscles slowly and completely in between each repetition. Repeat __________ times. Complete this exercise __________ times per day.  STRENGTH - Quadriceps, Short Arcs   Lie on your back. Place  a __________ inch towel roll under your knee so that the knee slightly bends.  Raise only your lower leg by tightening the muscles in the front of your thigh. Do not allow your thigh to rise.  Hold this position for __________ seconds. Repeat __________ times. Complete this exercise __________ times per day.  OPTIONAL ANKLE WEIGHTS: Begin with ____________________, but DO NOT exceed ____________________. Increase in 1 pound/0.5 kilogram increments.  STRENGTH - Quadriceps, Straight Leg Raises  Quality counts! Watch for signs that the quadriceps muscle is working to insure you are strengthening the correct muscles and not "cheating" by substituting with healthier muscles.  Lay on your back with your right / left leg extended and your opposite knee bent.  Tense the muscles in the front of your right / left thigh. You should see either your knee cap slide up or increased dimpling just above the knee. Your thigh may even quiver.  Tighten these muscles even more and raise your leg 4 to 6 inches off the floor. Hold for __________ seconds.  Keeping these muscles tense, lower your leg.  Relax the muscles slowly and completely in between each repetition. Repeat __________ times. Complete this exercise __________ times per day.  STRENGTH - Hamstring, Curls  Lay on your stomach with your legs extended. (If you lay on a bed, your feet may hang over the edge.)  Tighten the muscles in the back of your thigh to bend your right / left knee up to 90 degrees. Keep your hips flat on the bed/floor.  Hold this position for __________ seconds.  Slowly lower your leg back to the starting position. Repeat __________ times. Complete this exercise __________ times per day.  OPTIONAL ANKLE WEIGHTS: Begin with ____________________, but DO NOT exceed ____________________. Increase in 1 pound/0.5 kilogram increments.  STRENGTH - Quadriceps, Squats  Stand in a door frame so that your feet and knees are in line with  the frame.  Use your hands for balance, not support, on the frame.  Slowly lower your weight, bending at the hips and knees. Keep your lower legs upright so that they are parallel with the door frame. Squat only within the range that does not increase your knee pain. Never let your hips drop below your knees.  Slowly return upright, pushing with your legs, not pulling with your hands. Repeat __________ times. Complete this exercise __________ times per day.  STRENGTH - Quadriceps, Wall Slides  Follow guidelines for form closely. Increased knee pain often results from poorly placed feet or knees.  Lean against a smooth wall or  door and walk your feet out 18-24 inches. Place your feet hip-width apart.  Slowly slide down the wall or door until your knees bend __________ degrees.* Keep your knees over your heels, not your toes, and in line with your hips, not falling to either side.  Hold for __________ seconds. Stand up to rest for __________ seconds in between each repetition. Repeat __________ times. Complete this exercise __________ times per day. * Your physician, physical therapist, or athletic trainer will alter this angle based on your symptoms and progress.   This information is not intended to replace advice given to you by your health care provider. Make sure you discuss any questions you have with your health care provider.   Document Released: 04/07/2005 Document Revised: 06/14/2014 Document Reviewed: 09/05/2008 Elsevier Interactive Patient Education 2016 Elsevier Inc.      Document Released: 06/30/2005 Document Revised: 06/14/2014 Document Reviewed: 07/25/2013 Elsevier Interactive Patient Education [employer]

## 2016-04-06 ENCOUNTER — Other Ambulatory Visit (HOSPITAL_COMMUNITY): Payer: Self-pay | Admitting: Speech Pathology

## 2016-04-06 ENCOUNTER — Other Ambulatory Visit: Payer: Self-pay | Admitting: Physician Assistant

## 2016-04-06 DIAGNOSIS — Z23 Encounter for immunization: Secondary | ICD-10-CM | POA: Diagnosis not present

## 2016-04-06 DIAGNOSIS — Z1231 Encounter for screening mammogram for malignant neoplasm of breast: Secondary | ICD-10-CM

## 2016-05-26 ENCOUNTER — Ambulatory Visit
Admission: RE | Admit: 2016-05-26 | Discharge: 2016-05-26 | Disposition: A | Discharge status: Home / Self Care | Payer: BLUE CROSS/BLUE SHIELD | Source: Ambulatory Visit | Attending: Physician Assistant | Admitting: Physician Assistant

## 2016-05-26 DIAGNOSIS — Z1231 Encounter for screening mammogram for malignant neoplasm of breast: Secondary | ICD-10-CM | POA: Diagnosis not present

## 2016-08-02 ENCOUNTER — Ambulatory Visit (INDEPENDENT_AMBULATORY_CARE_PROVIDER_SITE_OTHER): Payer: BLUE CROSS/BLUE SHIELD | Admitting: Family Medicine

## 2016-08-02 VITALS — BP 140/88 | HR 70 | Temp 98.3°F | Resp 16 | Ht 64.0 in | Wt 169.4 lb

## 2016-08-02 DIAGNOSIS — Z114 Encounter for screening for human immunodeficiency virus [HIV]: Secondary | ICD-10-CM | POA: Diagnosis not present

## 2016-08-02 DIAGNOSIS — Z0000 Encounter for general adult medical examination without abnormal findings: Secondary | ICD-10-CM

## 2016-08-02 DIAGNOSIS — D508 Other iron deficiency anemias: Secondary | ICD-10-CM

## 2016-08-02 NOTE — Patient Instructions (Addendum)
   IF you received an x-ray today, you will receive an invoice from [facility]. Please contact [facility] at [phone] with questions or concerns regarding your invoice.   IF you received labwork today, you will receive an invoice from [facility]. Please contact [facility] at [phone] with questions or concerns regarding your invoice.   Our billing staff will not be able to assist you with questions regarding bills from these companies.  You will be contacted with the lab results as soon as they are available. The fastest way to get your results is to activate your My Chart account. Instructions are located on the last page of this paperwork. If you have not heard from us regarding the results in 2 weeks, please contact this office.     DASH Eating Plan DASH stands for "Dietary Approaches to Stop Hypertension." The DASH eating plan is a healthy eating plan that has been shown to reduce high blood pressure (hypertension). Additional health benefits may include reducing the risk of type 2 diabetes mellitus, heart disease, and stroke. The DASH eating plan may also help with weight loss. What do I need to know about the DASH eating plan? For the DASH eating plan, you will follow these general guidelines:  Choose foods with less than 150 milligrams of sodium per serving (as listed on the food label).  Use salt-free seasonings or herbs instead of table salt or sea salt.  Check with your health care provider or pharmacist before using salt substitutes.  Eat lower-sodium products. These are often labeled as "low-sodium" or "no salt added."  Eat fresh foods. Avoid eating a lot of canned foods.  Eat more vegetables, fruits, and low-fat dairy products.  Choose whole grains. Look for the word "whole" as the first word in the ingredient list.  Choose fish and skinless chicken or turkey more often than red meat. Limit fish, poultry, and meat to 6 oz (170 g) each  day.  Limit sweets, desserts, sugars, and sugary drinks.  Choose heart-healthy fats.  Eat more home-cooked food and less restaurant, buffet, and fast food.  Limit fried foods.  Do not fry foods. Cook foods using methods such as baking, boiling, grilling, and broiling instead.  When eating at a restaurant, ask that your food be prepared with less salt, or no salt if possible. What foods can I eat? Seek help from a dietitian for individual calorie needs. Grains  Whole grain or whole wheat bread. Brown rice. Whole grain or whole wheat pasta. Quinoa, bulgur, and whole grain cereals. Low-sodium cereals. Corn or whole wheat flour tortillas. Whole grain cornbread. Whole grain crackers. Low-sodium crackers. Vegetables  Fresh or frozen vegetables (raw, steamed, roasted, or grilled). Low-sodium or reduced-sodium tomato and vegetable juices. Low-sodium or reduced-sodium tomato sauce and paste. Low-sodium or reduced-sodium canned vegetables. Fruits  All fresh, canned (in natural juice), or frozen fruits. Meat and Other Protein Products  Ground beef (85% or leaner), grass-fed beef, or beef trimmed of fat. Skinless chicken or turkey. Ground chicken or turkey. Pork trimmed of fat. All fish and seafood. Eggs. Dried beans, peas, or lentils. Unsalted nuts and seeds. Unsalted canned beans. Dairy  Low-fat dairy products, such as skim or 1% milk, 2% or reduced-fat cheeses, low-fat ricotta or cottage cheese, or plain low-fat yogurt. Low-sodium or reduced-sodium cheeses. Fats and Oils  Tub margarines without trans fats. Light or reduced-fat mayonnaise and salad dressings (reduced sodium). Avocado. Safflower, olive, or canola oils. Natural peanut or almond butter. Other  Unsalted   popcorn and pretzels. The items listed above may not be a complete list of recommended foods or beverages. Contact your dietitian for more options.  What foods are not recommended? Grains  White bread. White pasta. White rice.  Refined cornbread. Bagels and croissants. Crackers that contain trans fat. Vegetables  Creamed or fried vegetables. Vegetables in a cheese sauce. Regular canned vegetables. Regular canned tomato sauce and paste. Regular tomato and vegetable juices. Fruits  Canned fruit in light or heavy syrup. Fruit juice. Meat and Other Protein Products  Fatty cuts of meat. Ribs, chicken wings, bacon, sausage, bologna, salami, chitterlings, fatback, hot dogs, bratwurst, and packaged luncheon meats. Salted nuts and seeds. Canned beans with salt. Dairy  Whole or 2% milk, cream, half-and-half, and cream cheese. Whole-fat or sweetened yogurt. Full-fat cheeses or blue cheese. Nondairy creamers and whipped toppings. Processed cheese, cheese spreads, or cheese curds. Condiments  Onion and garlic salt, seasoned salt, table salt, and sea salt. Canned and packaged gravies. Worcestershire sauce. Tartar sauce. Barbecue sauce. Teriyaki sauce. Soy sauce, including reduced sodium. Steak sauce. Fish sauce. Oyster sauce. Cocktail sauce. Horseradish. Ketchup and mustard. Meat flavorings and tenderizers. Bouillon cubes. Hot sauce. Tabasco sauce. Marinades. Taco seasonings. Relishes. Fats and Oils  Butter, stick margarine, lard, shortening, ghee, and bacon fat. Coconut, palm kernel, or palm oils. Regular salad dressings. Other  Pickles and olives. Salted popcorn and pretzels. The items listed above may not be a complete list of foods and beverages to avoid. Contact your dietitian for more information.  Where can I find more information? National Heart, Lung, and Blood Institute: www.nhlbi.nih.gov/health/health-topics/topics/dash/ This information is not intended to replace advice given to you by your health care provider. Make sure you discuss any questions you have with your health care provider. Document Released: 05/13/2011 Document Revised: 10/30/2015 Document Reviewed: 03/28/2013 Elsevier Interactive Patient Education  2017  Elsevier Inc.  

## 2016-08-02 NOTE — Progress Notes (Signed)
Chief Complaint  Patient presents with  . Annual Exam  . Gynecologic Exam    Subjective:  Ariana ReichmannSilvia [name] is a 51 y.o. female here for a health maintenance visit.  Patient is established pt  Last pap smear was in 2016 and was normal   No LMP recorded. Patient is not currently having periods (Reason: Perimenopausal). She had a tubal ligation    Patient Active Problem List   Diagnosis Date Noted  . Iron deficiency anemia, unspecified 09/03/2013  . Unspecified constipation 09/03/2013    Past Medical History:  Diagnosis Date  . Anemia     No past surgical history on file.   Outpatient Medications Prior to Visit  Medication Sig Dispense Refill  . Calcium Carbonate-Vitamin D (CALCIUM + D PO) Take by mouth. Reported on 08/08/2015    . ferrous sulfate 325 (65 FE) MG tablet Take 325 mg by mouth daily with breakfast. Reported on 08/08/2015    . glucosamine-chondroitin (MAX GLUCOSAMINE CHONDROITIN) 500-400 MG tablet Take 1 tablet by mouth 3 (three) times daily. (Patient not taking: Reported on 08/08/2015) 90 tablet 5  . meloxicam (MOBIC) 15 MG tablet Take 1 tablet (15 mg total) by mouth daily. (Patient not taking: Reported on 05/19/2015) 30 tablet 1  . meloxicam (MOBIC) 15 MG tablet Take 1 tablet (15 mg total) by mouth daily. (Patient not taking: Reported on 08/02/2016) 30 tablet 0  . neomycin-polymyxin-hydrocortisone (CORTISPORIN) otic solution Place 4 drops into the right ear 3 (three) times daily. (Patient not taking: Reported on 08/08/2015) 10 mL 0  . terbinafine (LAMISIL) 250 MG tablet Take 1 tablet (250 mg total) by mouth daily. (Patient not taking: Reported on 08/08/2015) 45 tablet 0  . ibuprofen (ADVIL,MOTRIN) 200 MG tablet Take 200 mg by mouth every 6 (six) hours as needed. Reported on 08/08/2015     No facility-administered medications prior to visit.     No Known Allergies   Family History  Problem Relation Age of Onset  . Cancer Mother   . Hypertension Mother   . Hypertension  Father   . Colon cancer Neg Hx   . Esophageal cancer Neg Hx   . Rectal cancer Neg Hx   . Stomach cancer Neg Hx      Health Habits: Dental Exam: up to date Eye Exam: up to date Exercise: 3 times/week on average Current exercise activities: walking/running Diet: balanced  Social History   Social History  . Marital status: Married    Spouse name: N/A  . Number of children: 3  . Years of education: N/A   Occupational History  . [employer]   Social History Main Topics  . Smoking status: Never Smoker  . Smokeless tobacco: Never Used  . Alcohol use No  . Drug use: No  . Sexual activity: Not on file   Other Topics Concern  . Not on file   Social History Narrative  . No narrative on file   History  Alcohol Use No   History  Smoking Status  . Never Smoker  Smokeless Tobacco  . Never Used   History  Drug Use No    GYN: Sexual Health Menstrual status: regular menses LMP: No LMP recorded. Patient is not currently having periods (Reason: Perimenopausal). Last period May 2017 Last pap smear: see HM section - 2016 History of abnormal pap smears: none Sexually active: with female partner Current contraception: tubal ligation  Health Maintenance: See under health Maintenance activity for review of completion dates as well.  Immunization History  Administered Date(s) Administered  . Influenza-Unspecified 03/07/2016      Depression Screen-PHQ2/9 Depression screen [facility] 2/9 08/02/2016 08/08/2015 05/19/2015  Decreased Interest 0 0 0  Down, Depressed, Hopeless 0 0 0  PHQ - 2 Score 0 0 0      Depression Severity and Treatment Recommendations:  0-4= None  5-9= Mild / Treatment: Support, educate to call if worse; return in one month  10-14= Moderate / Treatment: Support, watchful waiting; Antidepressant or Psycotherapy  15-19= Moderately severe / Treatment: Antidepressant OR Psychotherapy  >= 20 = Major depression, severe / Antidepressant AND  Psychotherapy    Review of Systems   Review of Systems  Constitutional: Negative for chills, fever, malaise/fatigue and weight loss.  HENT: Negative for hearing loss and nosebleeds.   Eyes: Negative for blurred vision, double vision, discharge and redness.  Respiratory: Negative for cough, shortness of breath and wheezing.   Cardiovascular: Negative for chest pain, palpitations, orthopnea and claudication.  Gastrointestinal: Negative for abdominal pain, blood in stool, constipation, diarrhea, nausea and vomiting.  Genitourinary: Negative for dysuria, frequency, hematuria and urgency.  Skin: Negative for itching and rash.  Neurological: Negative for sensory change, speech change and focal weakness.  Psychiatric/Behavioral: Negative for depression. The patient is not nervous/anxious and does not have insomnia.     See HPI for ROS as well.    Objective:   Vitals:   08/02/16 0902  BP: 140/88  Pulse: 70  Resp: 16  Temp: 98.3 F (36.8 C)  TempSrc: Oral  SpO2: 99%  Weight: 169 lb 6.4 oz (76.8 kg)  Height: 5\' 4"  (1.626 m)    Body mass index is 29.08 kg/m.  Physical Exam  Constitutional: She is oriented to person, place, and time. She appears well-developed and well-nourished.  HENT:  Head: Normocephalic and atraumatic.  Right Ear: External ear normal.  Left Ear: External ear normal.  Nose: Nose normal.  Mouth/Throat: Oropharynx is clear and moist. No oropharyngeal exudate.  Eyes: Conjunctivae and EOM are normal. Pupils are equal, round, and reactive to light. Right eye exhibits no discharge.  Neck: Normal range of motion. Neck supple.  Cardiovascular: Normal rate, regular rhythm and normal heart sounds.   No murmur heard. Pulmonary/Chest: Effort normal and breath sounds normal. No respiratory distress. She has no wheezes. She has no rales. Right breast exhibits no inverted nipple, no mass, no nipple discharge, no skin change and no tenderness. Left breast exhibits no  inverted nipple, no mass, no nipple discharge, no skin change and no tenderness. Breasts are symmetrical.  Abdominal: Soft. Bowel sounds are normal. She exhibits no distension. There is no tenderness. There is no rebound and no guarding.  Musculoskeletal: Normal range of motion. She exhibits no edema.  Neurological: She is alert and oriented to person, place, and time. She has normal reflexes. No cranial nerve deficit.  Skin: Skin is warm. No erythema.  Psychiatric: She has a normal mood and affect. Her behavior is normal. Judgment and thought content normal.       Assessment/Plan:   Patient was seen for a health maintenance exam.  Counseled the patient on health maintenance issues. Reviewed her health mainteance schedule and ordered appropriate tests (see orders.) Counseled on regular exercise and weight management. Recommend regular eye exams and dental cleaning.   The following issues were addressed today for health maintenance:   Ryiah was seen today for annual exam and gynecologic exam.  Diagnoses and all orders for this visit:  Health maintenance examination-  mammogram up to date Colonoscopy up to date Pap up to date Other age appropriate screenings done -     CBC with Differential/Platelet -     Basic metabolic panel -     Lipid panel -     HIV antibody  Other iron deficiency anemia- pt with history of anemia Will check hemglobin today She does not take otc iron but eats iron rich foods -     CBC with Differential/Platelet  Encounter for HIV (human immunodeficiency virus) test -     HIV antibody    No Follow-up on file.    Body mass index is 29.08 kg/m.:  Discussed the patient's BMI with patient. The BMI body mass index is 29.08 kg/m.     No future appointments.  Patient Instructions       IF you received an x-ray today, you will receive an invoice from [facility]. Please contact [facility] at [phone] with questions or  concerns regarding your invoice.   IF you received labwork today, you will receive an invoice from [facility] [facility]. Please contact [facility] at [phone] with questions or concerns regarding your invoice.   Our billing staff will not be able to assist you with questions regarding bills from these companies.  You will be contacted with the lab results as soon as they are available. The fastest way to get your results is to activate your My Chart account. Instructions are located on the last page of this paperwork. If you have not heard from [place] regarding the results in 2 weeks, please contact this office.    DASH Eating Plan DASH stands for "Dietary Approaches to Stop Hypertension." The DASH eating plan is a healthy eating plan that has been shown to reduce high blood pressure (hypertension). Additional health benefits may include reducing the risk of type 2 diabetes mellitus, heart disease, and stroke. The DASH eating plan may also help with weight loss. What do I need to know about the DASH eating plan? For the DASH eating plan, you will follow these general guidelines:  Choose foods with less than 150 milligrams of sodium per serving (as listed on the food label).  Use salt-free seasonings or herbs instead of table salt or sea salt.  Check with your health care provider or pharmacist before using salt substitutes.  Eat lower-sodium products. These are often labeled as "low-sodium" or "no salt added."  Eat fresh foods. Avoid eating a lot of canned foods.  Eat more vegetables, fruits, and low-fat dairy products.  Choose whole grains. Look for the word "whole" as the first word in the ingredient list.  Choose fish and skinless chicken or [place] more often than red meat. Limit fish, poultry, and meat to 6 oz (170 g) each day.  Limit sweets, desserts, sugars, and sugary drinks.  Choose heart-healthy fats.  Eat more home-cooked food and less restaurant, buffet, and fast food.  Limit fried  foods.  Do not fry foods. Cook foods using methods such as baking, boiling, grilling, and broiling instead.  When eating at a restaurant, ask that your food be prepared with less salt, or no salt if possible. What foods can I eat? Seek help from a dietitian for individual calorie needs. Grains  Whole grain or whole wheat bread. Brown rice. Whole grain or whole wheat pasta. Quinoa, bulgur, and whole grain cereals. Low-sodium cereals. Corn or whole wheat flour tortillas. Whole grain cornbread. Whole grain crackers. Low-sodium crackers. Vegetables  Fresh or frozen vegetables (raw, steamed, roasted,  or grilled). Low-sodium or reduced-sodium tomato and vegetable juices. Low-sodium or reduced-sodium tomato sauce and paste. Low-sodium or reduced-sodium canned vegetables. Fruits  All fresh, canned (in natural juice), or frozen fruits. Meat and Other Protein Products  Ground beef (85% or leaner), grass-fed beef, or beef trimmed of fat. Skinless chicken or [place]. Ground chicken or [place]. Pork trimmed of fat. All fish and seafood. Eggs. Dried beans, peas, or lentils. Unsalted nuts and seeds. Unsalted canned beans. Dairy  Low-fat dairy products, such as skim or 1% milk, 2% or reduced-fat cheeses, low-fat ricotta or cottage cheese, or plain low-fat yogurt. Low-sodium or reduced-sodium cheeses. Fats and Oils  Tub margarines without trans fats. Light or reduced-fat mayonnaise and salad dressings (reduced sodium). Avocado. Safflower, olive, or canola oils. Natural peanut or almond butter. Other  Unsalted popcorn and pretzels. The items listed above may not be a complete list of recommended foods or beverages. Contact your dietitian for more options.  What foods are not recommended? Grains  White bread. White pasta. White rice. Refined cornbread. Bagels and croissants. Crackers that contain trans fat. Vegetables  Creamed or fried vegetables. Vegetables in a cheese sauce. Regular canned vegetables. Regular  canned tomato sauce and paste. Regular tomato and vegetable juices. Fruits  Canned fruit in light or heavy syrup. Fruit juice. Meat and Other Protein Products  Fatty cuts of meat. Ribs, chicken wings, bacon, sausage, bologna, salami, chitterlings, fatback, hot dogs, bratwurst, and packaged luncheon meats. Salted nuts and seeds. Canned beans with salt. Dairy  Whole or 2% milk, cream, half-and-half, and cream cheese. Whole-fat or sweetened yogurt. Full-fat cheeses or blue cheese. Nondairy creamers and whipped toppings. Processed cheese, cheese spreads, or cheese curds. Condiments  Onion and garlic salt, seasoned salt, table salt, and sea salt. Canned and packaged gravies. Worcestershire sauce. Tartar sauce. Barbecue sauce. Teriyaki sauce. Soy sauce, including reduced sodium. Steak sauce. Fish sauce. Oyster sauce. Cocktail sauce. Horseradish. Ketchup and mustard. Meat flavorings and tenderizers. Bouillon cubes. Hot sauce. Tabasco sauce. Marinades. Taco seasonings. Relishes. Fats and Oils  Butter, stick margarine, lard, shortening, ghee, and bacon fat. Coconut, palm kernel, or palm oils. Regular salad dressings. Other  Pickles and olives. Salted popcorn and pretzels. The items listed above may not be a complete list of foods and beverages to avoid. Contact your dietitian for more information.  Where can I find more information? National Heart, Lung, and Blood Institute: [employer] This information is not intended to replace advice given to you by your health care provider. Make sure you discuss any questions you have with your health care provider. Document Released: 05/13/2011 Document Revised: 10/30/2015 Document Reviewed: 03/28/2013 Elsevier Interactive Patient Education  2017 [employer].

## 2016-08-03 LAB — CBC WITH DIFFERENTIAL/PLATELET
BASOS ABS: 0 10*3/uL (ref 0.0–0.2)
Basos: 1 %
EOS (ABSOLUTE): 0.2 10*3/uL (ref 0.0–0.4)
Eos: 3 %
Hematocrit: 41.2 % (ref 34.0–46.6)
Hemoglobin: 13.4 g/dL (ref 11.1–15.9)
Immature Grans (Abs): 0 10*3/uL (ref 0.0–0.1)
Immature Granulocytes: 0 %
Lymphocytes Absolute: 1.6 10*3/uL (ref 0.7–3.1)
Lymphs: 26 %
MCH: 29.1 pg (ref 26.6–33.0)
MCHC: 32.5 g/dL (ref 31.5–35.7)
MCV: 89 fL (ref 79–97)
Monocytes Absolute: 0.4 10*3/uL (ref 0.1–0.9)
Monocytes: 6 %
Neutrophils Absolute: 3.8 10*3/uL (ref 1.4–7.0)
Neutrophils: 64 %
Platelets: 314 10*3/uL (ref 150–379)
RBC: 4.61 x10E6/uL (ref 3.77–5.28)
RDW: 14 % (ref 12.3–15.4)
WBC: 6 10*3/uL (ref 3.4–10.8)

## 2016-08-03 LAB — BASIC METABOLIC PANEL
BUN / CREAT RATIO: 11 (ref 9–23)
BUN: 11 mg/dL (ref 6–24)
CO2: 24 mmol/L (ref 18–29)
CREATININE: 0.99 mg/dL (ref 0.57–1.00)
Calcium: 9.3 mg/dL (ref 8.7–10.2)
Chloride: 104 mmol/L (ref 96–106)
GFR calc Af Amer: 77 mL/min/{1.73_m2} (ref >59)
GFR calc non Af Amer: 67 mL/min/{1.73_m2} (ref >59)
GLUCOSE: 136 mg/dL — AB (ref 65–99)
POTASSIUM: 4.5 mmol/L (ref 3.5–5.2)
Sodium: 144 mmol/L (ref 134–144)

## 2016-08-03 LAB — HIV ANTIBODY (ROUTINE TESTING W REFLEX): HIV Screen 4th Generation wRfx: NONREACTIVE

## 2016-08-03 LAB — LIPID PANEL
Chol/HDL Ratio: 3 ratio units (ref 0.0–4.4)
Cholesterol, Total: 139 mg/dL (ref 100–199)
HDL: 46 mg/dL (ref >39)
LDL CALC: 71 mg/dL (ref 0–99)
Triglycerides: 112 mg/dL (ref 0–149)
VLDL Cholesterol Cal: 22 mg/dL (ref 5–40)

## 2016-08-04 ENCOUNTER — Encounter: Payer: Self-pay | Admitting: Family Medicine

## 2016-08-23 ENCOUNTER — Ambulatory Visit (INDEPENDENT_AMBULATORY_CARE_PROVIDER_SITE_OTHER): Payer: BLUE CROSS/BLUE SHIELD | Admitting: Family Medicine

## 2016-08-23 VITALS — BP 154/82 | HR 70 | Temp 99.1°F | Resp 16 | Ht 64.0 in | Wt 168.4 lb

## 2016-08-23 DIAGNOSIS — R7301 Impaired fasting glucose: Secondary | ICD-10-CM | POA: Diagnosis not present

## 2016-08-23 LAB — POCT GLYCOSYLATED HEMOGLOBIN (HGB A1C): Hemoglobin A1C: 5.3

## 2016-08-23 NOTE — Progress Notes (Signed)
  Chief Complaint  Patient presents with  . Follow-up    glucose    HPI   Pt is here to follow up on elevated glucose that was 136 on fasting labs 08/02/16 She reports that she does not have a history of gestational diabetes and no family history of diabetes.   - no polyuria, polydipsia, polyphagia   Past Medical History:  Diagnosis Date  . Anemia     Current Outpatient Prescriptions  Medication Sig Dispense Refill  . Calcium Carbonate-Vitamin D (CALCIUM + D PO) Take by mouth. Reported on 08/08/2015    . ferrous sulfate 325 (65 FE) MG tablet Take 325 mg by mouth daily with breakfast. Reported on 08/08/2015     No current facility-administered medications for this visit.     Allergies: No Known Allergies  No past surgical history on file.  Social History   Social History  . Marital status: Married    Spouse name: N/A  . Number of children: 3  . Years of education: N/A   Occupational History  . [employer]   Social History Main Topics  . Smoking status: Never Smoker  . Smokeless tobacco: Never Used  . Alcohol use No  . Drug use: No  . Sexual activity: Not Asked   Other Topics Concern  . None   Social History Narrative  . None    ROS  Objective: Vitals:   08/23/16 0938 08/23/16 1033  BP: (!) 155/93 (!) 154/82  Pulse: 70   Resp: 16   Temp: 99.1 F (37.3 C)   TempSrc: Oral   SpO2: 99%   Weight: 168 lb 6.4 oz (76.4 kg)   Height: 5\' 4"  (1.626 m)    BP Readings from Last 3 Encounters:  08/23/16 (!) 154/82  08/02/16 140/88  08/08/15 118/72   Lab Results  Component Value Date   HGBA1C 5.3 08/23/2016    Physical Exam  Constitutional: She is oriented to person, place, and time. She appears well-developed and well-nourished.  HENT:  Head: Normocephalic and atraumatic.  Pulmonary/Chest: Effort normal.  Neurological: She is alert and oriented to person, place, and time.  Skin: Skin is warm. Capillary refill takes less than 2  seconds.    Assessment and Plan Genella RifeSilvia was seen today for follow-up.  Diagnoses and all orders for this visit:  Impaired fasting glucose-  No diabetes or prediabetes Discussed screening and how to prevent diabetes Pt to follow up with a routine schedule -     POCT glycosylated hemoglobin (Hb A1C)     Yasaman Kolek A Arnaldo Heffron

## 2016-08-23 NOTE — Patient Instructions (Addendum)
Preventing Type 2 Diabetes Mellitus Type 2 diabetes (type 2 diabetes mellitus) is a long-term (chronic) disease that affects blood sugar (glucose) levels. Normally, a hormone called insulin allows glucose to enter cells in the body. The cells use glucose for energy. In type 2 diabetes, one or both of these problems may be present:  The body does not make enough insulin.  The body does not respond properly to insulin that it makes (insulin resistance). Insulin resistance or lack of insulin causes excess glucose to build up in the blood instead of going into cells. As a result, high blood glucose (hyperglycemia) develops, which can cause many complications. Being overweight or obese and having an inactive (sedentary) lifestyle can increase your risk for diabetes. Type 2 diabetes can be delayed or prevented by making certain nutrition and lifestyle changes. What nutrition changes can be made?  Eat healthy meals and snacks regularly. Keep a healthy snack with you for when you get hungry between meals, such as fruit or a handful of nuts.  Eat lean meats and proteins that are low in saturated fats, such as chicken, fish, egg whites, and beans. Avoid processed meats.  Eat plenty of fruits and vegetables and plenty of grains that have not been processed (whole grains). It is recommended that you eat:  1?2 cups of fruit every day.  2?3 cups of vegetables every day.  6?8 oz of whole grains every day, such as oats, whole wheat, bulgur, brown rice, quinoa, and millet.  Eat low-fat dairy products, such as milk, yogurt, and cheese.  Eat foods that contain healthy fats, such as nuts, avocado, olive oil, and canola oil.  Drink water throughout the day. Avoid drinks that contain added sugar, such as soda or sweet tea.  Follow instructions from your health care provider about specific eating or drinking restrictions.  Control how much food you eat at a time (portion size).  Check food labels to find  out the serving sizes of foods.  Use a kitchen scale to weigh amounts of foods.  Saute or steam food instead of frying it. Cook with water or broth instead of oils or butter.  Limit your intake of:  Salt (sodium). Have no more than 1 tsp (2,400 mg) of sodium a day. If you have heart disease or high blood pressure, have less than ? tsp (1,500 mg) of sodium a day.  Saturated fat. This is fat that is solid at room temperature, such as butter or fat on meat. What lifestyle changes can be made?   Activity   Do moderate-intensity physical activity for at least 30 minutes on at least 5 days of the week, or as much as told by your health care provider.  Ask your health care provider what activities are safe for you. A mix of physical activities may be best, such as walking, swimming, cycling, and strength training.  Try to add physical activity into your day. For example:  Park in spots that are farther away than usual, so that you walk more. For example, park in a far corner of the parking lot when you go to the office or the grocery store.  Take a walk during your lunch break.  Use stairs instead of elevators or escalators. Weight Loss   Lose weight as directed. Your health care provider can determine how much weight loss is best for you and can help you lose weight safely.  If you are overweight or obese, you may be instructed to lose at least   5?7 % of your body weight. Alcohol and Tobacco      Do not use any tobacco products, such as cigarettes, chewing tobacco, and e-cigarettes. If you need help quitting, ask your health care provider. Work With Your Health Care Provider   Have your blood glucose tested regularly, as told by your health care provider.  Discuss your risk factors and how you can reduce your risk for diabetes.  Get screening tests as told by your health care provider. You may have screening tests regularly, especially if you have certain risk factors for type 2  diabetes.  Make an appointment with a diet and nutrition specialist (registered dietitian). A registered dietitian can help you make a healthy eating plan and can help you understand portion sizes and food labels. Why are these changes important?  It is possible to prevent or delay type 2 diabetes and related health problems by making lifestyle and nutrition changes.  It can be difficult to recognize signs of type 2 diabetes. The best way to avoid possible damage to your body is to take actions to prevent the disease before you develop symptoms. What can happen if changes are not made?  Your blood glucose levels may keep increasing. Having high blood glucose for a long time is dangerous. Too much glucose in your blood can damage your blood vessels, heart, kidneys, nerves, and eyes.  You may develop prediabetes or type 2 diabetes. Type 2 diabetes can lead to many chronic health problems and complications, such as:  Heart disease.  Stroke.  Blindness.  Kidney disease.  Depression.  Poor circulation in the feet and legs, which could lead to surgical removal (amputation) in severe cases. Where to find support:  Ask your health care provider to recommend a registered dietitian, diabetes educator, or weight loss program.  Look for local or online weight loss groups.  Join a gym, fitness club, or outdoor activity group, such as a walking club. Where to find more information: To learn more about diabetes and diabetes prevention, visit:  American Diabetes Association (ADA): www.diabetes.org  National Institute of Diabetes and Digestive and Kidney Diseases: www.niddk.nih.gov/health-information/diabetes To learn more about healthy eating, visit:  The U.S. Department of Agriculture (USDA), Choose My Plate: www.choosemyplate.gov/food-groups  Office of Disease Prevention and Health Promotion (ODPHP), Dietary Guidelines: www.health.gov/dietaryguidelines Summary  You can reduce your risk  for type 2 diabetes by increasing your physical activity, eating healthy foods, and losing weight as directed.  Talk with your health care provider about your risk for type 2 diabetes. Ask about any blood tests or screening tests that you need to have. This information is not intended to replace advice given to you by your health care provider. Make sure you discuss any questions you have with your health care provider. Document Released: 09/15/2015 Document Revised: 10/30/2015 Document Reviewed: 07/15/2015 Elsevier Interactive Patient Education  2017 [employer] Inc.  

## 2016-12-22 ENCOUNTER — Ambulatory Visit (INDEPENDENT_AMBULATORY_CARE_PROVIDER_SITE_OTHER): Payer: BLUE CROSS/BLUE SHIELD | Admitting: Physician Assistant

## 2016-12-22 ENCOUNTER — Encounter: Payer: Self-pay | Admitting: Physician Assistant

## 2016-12-22 VITALS — BP 175/96 | HR 70 | Temp 98.0°F | Resp 17 | Ht 65.5 in | Wt 168.0 lb

## 2016-12-22 DIAGNOSIS — R030 Elevated blood-pressure reading, without diagnosis of hypertension: Secondary | ICD-10-CM

## 2016-12-22 DIAGNOSIS — Z113 Encounter for screening for infections with a predominantly sexual mode of transmission: Secondary | ICD-10-CM | POA: Diagnosis not present

## 2016-12-22 DIAGNOSIS — N898 Other specified noninflammatory disorders of vagina: Secondary | ICD-10-CM | POA: Diagnosis not present

## 2016-12-22 LAB — POCT WET + KOH PREP
TRICH BY WET PREP: ABSENT
YEAST BY WET PREP: ABSENT
Yeast by KOH: ABSENT

## 2016-12-22 NOTE — Progress Notes (Signed)
Ariana [name]  MRN: [id number] DOB: February 14, 1966  PCP: Patient, No Pcp Per  Chief Complaint  Patient presents with  . patient want STD check    Subjective:  Pt presents to clinic for STD check.  Her husband has the white bumps on his glans penis and she has never seen them before.  He is having no other symptoms and she thinks that he is monogamous but she wants to make sure she has nothing. She is not having any symptoms.  History is obtained by patient.  Review of Systems  Constitutional: Negative for chills and fever.  Gastrointestinal: Negative for abdominal pain.  Genitourinary: Negative.  Negative for vaginal discharge.    Patient Active Problem List   Diagnosis Date Noted  . Iron deficiency anemia, unspecified 09/03/2013    No current outpatient prescriptions on file prior to visit.   No current facility-administered medications on file prior to visit.     No Known Allergies  Past Medical History:  Diagnosis Date  . Anemia    Social History   Social History Narrative  . No narrative on file   Social History  Substance Use Topics  . Smoking status: Never Smoker  . Smokeless tobacco: Never Used  . Alcohol use No   family history includes Cancer in her mother; Hypertension in her father and mother.     Objective:  BP (!) 175/96   Pulse 70   Temp 98 F (36.7 C) (Oral)   Resp 17   Ht 5' 5.5" (1.664 m)   Wt 168 lb (76.2 kg)   SpO2 98%   BMI 27.53 kg/m  Body mass index is 27.53 kg/m.  Physical Exam  Constitutional: She is oriented to person, place, and time and well-developed, well-nourished, and in no distress.  HENT:  Head: Normocephalic and atraumatic.  Right Ear: Hearing and external ear normal.  Left Ear: Hearing and external ear normal.  Eyes: Conjunctivae are normal.  Neck: Normal range of motion.  Cardiovascular: Normal rate, regular rhythm and normal heart sounds.   No murmur heard. Pulmonary/Chest: Effort normal and breath sounds  normal. She has no wheezes.  Genitourinary: Vagina normal, uterus normal, cervix normal, right adnexa normal, left adnexa normal and vulva normal.  Neurological: She is alert and oriented to person, place, and time. Gait normal.  Skin: Skin is warm and dry.  Psychiatric: Mood, memory, affect and judgment normal.  Vitals reviewed.    Results for orders placed or performed in visit on 12/22/16  RPR  Result Value Ref Range   RPR Ser Ql Non Reactive Non Reactive  HIV antibody  Result Value Ref Range   HIV Screen 4th Generation wRfx Non Reactive Non Reactive  POCT Wet + KOH Prep  Result Value Ref Range   Yeast by KOH Absent Absent   Yeast by wet prep Absent Absent   WBC by wet prep Many (A) Few   Clue Cells Wet Prep HPF POC None None   Trich by wet prep Absent Absent   Bacteria Wet Prep HPF POC Many (A) Few   Epithelial Cells By [employer] ([facility]) Moderate (A) None, Few, Too numerous to count   RBC,UR,HPF,POC None None RBC/hpf      Assessment and Plan :  Vaginal discharge - Plan: HIV antibody, GC/Chlamydia Probe Amp, POCT Wet + KOH Prep  Screen for STD (sexually transmitted disease) - Plan: RPR  Elevated BP without diagnosis of hypertension - elevated BP today and at last visit -  she is worried about possible STD - she will make an appt to recheck with me to ensure this is not really elevated or if still elevated will have medication start.  Benny LennertSarah Berkleigh Beckles PA-C  [facility] at [facility] 12/24/2016 6:45 AM

## 2016-12-22 NOTE — Patient Instructions (Addendum)
     IF you received an x-ray today, you will receive an invoice from [facility]. Please contact [facility] at [phone] with questions or concerns regarding your invoice.   IF you received labwork today, you will receive an invoice from [facility]. Please contact [facility] at [phone] with questions or concerns regarding your invoice.   Our billing staff will not be able to assist you with questions regarding bills from these companies.  You will be contacted with the lab results as soon as they are available. The fastest way to get your results is to activate your My Chart account. Instructions are located on the last page of this paperwork. If you have not heard from us regarding the results in 2 weeks, please contact this office.     I will contact you with your lab results as soon as they are available.   If you have not heard from me in 2 weeks, please contact me.  The fastest way to get your results is to register for My Chart (see the instructions on the last page of this printout).

## 2016-12-23 LAB — HIV ANTIBODY (ROUTINE TESTING W REFLEX): HIV SCREEN 4TH GENERATION: NONREACTIVE

## 2016-12-23 LAB — RPR: RPR: NONREACTIVE

## 2016-12-24 ENCOUNTER — Encounter: Payer: Self-pay | Admitting: Physician Assistant

## 2016-12-27 LAB — GC/CHLAMYDIA PROBE AMP
Chlamydia trachomatis, NAA: NEGATIVE
Neisseria gonorrhoeae by PCR: NEGATIVE

## 2017-01-08 ENCOUNTER — Telehealth: Payer: Self-pay | Admitting: Physician Assistant

## 2017-01-08 NOTE — Telephone Encounter (Signed)
Patient would like someone to give her a call about her lab results.  Her call back number is [phone].

## 2017-01-08 NOTE — Telephone Encounter (Signed)
Spoke with patient and gave lab results. Patient had no further questions.

## 2017-03-22 DIAGNOSIS — Z23 Encounter for immunization: Secondary | ICD-10-CM | POA: Diagnosis not present

## 2017-05-18 ENCOUNTER — Other Ambulatory Visit: Payer: Self-pay | Admitting: Physician Assistant

## 2017-05-18 DIAGNOSIS — Z139 Encounter for screening, unspecified: Secondary | ICD-10-CM

## 2017-06-20 ENCOUNTER — Ambulatory Visit
Admission: RE | Admit: 2017-06-20 | Discharge: 2017-06-20 | Disposition: A | Discharge status: Home / Self Care | Payer: BLUE CROSS/BLUE SHIELD | Source: Ambulatory Visit | Attending: Physician Assistant | Admitting: Physician Assistant

## 2017-06-20 DIAGNOSIS — Z139 Encounter for screening, unspecified: Secondary | ICD-10-CM

## 2017-06-20 DIAGNOSIS — Z1231 Encounter for screening mammogram for malignant neoplasm of breast: Secondary | ICD-10-CM | POA: Diagnosis not present

## 2017-08-13 ENCOUNTER — Other Ambulatory Visit: Payer: Self-pay

## 2017-08-13 ENCOUNTER — Encounter: Payer: Self-pay | Admitting: Physician Assistant

## 2017-08-13 ENCOUNTER — Ambulatory Visit: Payer: BLUE CROSS/BLUE SHIELD | Admitting: Physician Assistant

## 2017-08-13 VITALS — BP 143/91 | HR 71 | Temp 98.4°F | Resp 16 | Ht 64.0 in | Wt 166.8 lb

## 2017-08-13 DIAGNOSIS — R070 Pain in throat: Secondary | ICD-10-CM

## 2017-08-13 DIAGNOSIS — R0982 Postnasal drip: Secondary | ICD-10-CM | POA: Diagnosis not present

## 2017-08-13 LAB — POCT RAPID STREP A (OFFICE): Rapid Strep A Screen: NEGATIVE

## 2017-08-13 MED ORDER — FLUTICASONE PROPIONATE 50 MCG/ACT NA SUSP: 2.0000 | Freq: Every day | NASAL | 6 refills | Status: DC

## 2017-08-13 MED ORDER — LORATADINE-PSEUDOEPHEDRINE ER 5-120 MG PO TB12
1.0000 | ORAL_TABLET | Freq: Two times a day (BID) | ORAL | 0 refills | Status: DC
Start: 2017-08-13 — End: 2019-05-16

## 2017-08-13 NOTE — Patient Instructions (Addendum)
Start using Flonase twice daily. Be sure to use this at night before you go to bed.  Claritin-D - take this twice daily for the next week.  Place a humidifier in your room at night while you sleep Be sure to stay well hydrated. Drink 1-2 liters of water daily.  Take advil or ibuprofen for your spasm.  If your throat is not better in 1-2 weeks, call and leave me a message, I will call in an antibiotic for you.  Come back and see me if you are not improving in 3 weeks.   Thank you for coming in today. I hope you feel we met your needs.  Feel free to call PCP if you have any questions or further requests.  Please consider signing up for MyChart if you do not already have it, as this is a great way to communicate with me.  Best,  Mercer Pod, PA-C   CDC Urges Public To Be Antibiotics Aware The Centers for Disease Control and Prevention (CDC) encourages patients and families to Be Antibiotics Aware by learning about safe antibiotic use. Each year in the [place], at least 2 million people get infected with antibiotic-resistant bacteria. At least 23,000 die as a result. Antibiotic resistance, one of the most urgent threats to the public's health, occurs when bacteria develop the ability to defeat the drugs designed to kill them.  What Do Antibiotics Treat? When you get a prescription for antibiotics, follow your doctor's instructions carefully.  Antibiotics are critical tools for treating a number of common infections, such as pneumonia, and for life-threatening conditions including sepsis. Antibiotics are only needed for treating certain infections caused by bacteria.  What Don't Antibiotics Treat? Antibiotics do not work on viruses, such as colds and flu, or runny noses, even if the mucus is thick, yellow or green. Antibiotics also won't help some common bacterial infections including most cases of bronchitis, many sinus infections, and some ear infections.  What Are The Side Effects  of Antibiotics? Any time antibiotics are used, they can cause side effects and lead to antibiotic resistance. When antibiotics aren't needed, they won't help you, and the side effects could still hurt you. Common side effects range from things like rashes and yeast infections to severe health problems. More serious side effects include Clostridium difficile infection (also called C. difficile or C. diff), which causes diarrhea that can lead to severe colon damage and death. If you need antibiotics, take them exactly as prescribed. Patients and families can talk to their healthcare professional if they have any questions about their antibiotics, or if they develop side effects, especially diarrhea, since that could be C. difficile, which needs to be treated.  Can I Feel Better Without Antibiotics? Patients and families can ask their healthcare professional about the best way to feel better while their body fights off the virus. Respiratory viruses usually go away in a week or two without treatment.  How Can I Stay Healthy? Regular hand-washing can go a long way toward protecting you from germs.  We can all stay healthy and keep others healthy by cleaning our hands, covering our coughs, staying home when sick, and getting recommended vaccines, for the flu, for example. Antibiotics save lives. When a patient needs antibiotics, the benefits outweigh the risks of side effects and antibiotic resistance. Improving the way we take antibiotics helps keep [place] healthy now, helps fight antibiotic resistance, and ensures that life-saving antibiotics will be available for future generations.  To learn more  about antibiotic prescribing and use, visit [email or web address].   IF you received an x-ray today, you will receive an invoice from [facility]. Please contact [facility] at [phone] with questions or concerns regarding your invoice.   IF you received labwork today, you will receive  an invoice from [facility]. Please contact [facility] at [phone] with questions or concerns regarding your invoice.   Our billing staff will not be able to assist you with questions regarding bills from these companies.  You will be contacted with the lab results as soon as they are available. The fastest way to get your results is to activate your My Chart account. Instructions are located on the last page of this paperwork. If you have not heard from [place] regarding the results in 2 weeks, please contact this office.

## 2017-08-13 NOTE — Progress Notes (Signed)
Ariana [name]  MRN: [id number] DOB: Apr 04, 1966  PCP: Patient, No Pcp Per  Subjective:  Pt is a 52 year old female who presents to clinic for throat pain x 1 week.  Pain is mild. R>L. Worse in the mornings.  Mild pain with swallowing. This is not preventing her from eating or drinking as per usual.  Endorses intermittent cough daily.  She has not taken anything to feel better. She has these symptoms intermittently, however this episode has lasted about 1 week.  She used to take Claritin-D, has not taken this in several years.  C/o "cramps" of her throat sometimes when she yawns. Does not happen daily.  Denies voice change, sour taste in mouth, regurgitation symptoms, abdominal pain, fever, chills, difficulty breathing.  No concern regarding chicken or fish bones, etc.   Review of Systems  Constitutional: Negative for appetite change, chills, diaphoresis, fatigue and fever.  HENT: Positive for sore throat and trouble swallowing (mild pain). Negative for congestion, postnasal drip, rhinorrhea, sinus pressure, sinus pain and voice change.   Respiratory: Positive for cough. Negative for chest tightness, shortness of breath and wheezing.   Cardiovascular: Negative for chest pain.    Patient Active Problem List   Diagnosis Date Noted  . Iron deficiency anemia, unspecified 09/03/2013    No current outpatient medications on file prior to visit.   No current facility-administered medications on file prior to visit.     No Known Allergies   Objective:  BP (!) 143/91   Pulse 71   Temp 98.4 F (36.9 C) (Oral)   Resp 16   Ht 5\' 4"  (1.626 m)   Wt 166 lb 12.8 oz (75.7 kg)   SpO2 99%   BMI 28.63 kg/m   Physical Exam  Constitutional: She is oriented to person, place, and time and well-developed, well-nourished, and in no distress. No distress.  HENT:  Right Ear: Tympanic membrane normal.  Left Ear: Tympanic membrane normal.  Nose: Mucosal edema present. No rhinorrhea. Right  sinus exhibits no maxillary sinus tenderness and no frontal sinus tenderness. Left sinus exhibits no maxillary sinus tenderness and no frontal sinus tenderness.  Mouth/Throat: Oropharynx is clear and moist and mucous membranes are normal.  Cobblestoning of oropharynx   Cardiovascular: Normal rate, regular rhythm and normal heart sounds.  Pulmonary/Chest: Effort normal and breath sounds normal. No respiratory distress. She has no wheezes. She has no rales.  Neurological: She is alert and oriented to person, place, and time. GCS score is 15.  Skin: Skin is warm and dry.  Psychiatric: Mood, memory, affect and judgment normal.  Vitals reviewed.   Results for orders placed or performed in visit on 08/13/17  POCT rapid strep A  Result Value Ref Range   Rapid Strep A Screen Negative Negative    Assessment and Plan :  1. Throat pain in adult - POCT rapid strep A - Culture, Group A Strep  2. Post-nasal drip - fluticasone (FLONASE) 50 MCG/ACT nasal spray; Place 2 sprays into both nostrils daily.  Dispense: 16 g; Refill: 6 - loratadine-pseudoephedrine (CLARITIN-D 12 HOUR) 5-120 MG tablet; Take 1 tablet by mouth 2 (two) times daily.  Dispense: 30 tablet; Refill: 0 - Pt c/o sore throat x 1 week. Negative rapid strep.  When discussed post nasal drip as a possible cause she seemed a little irritated and refused this diagnosis. Plan to treat with antihistamines considering PE findings. Will consider antibiotic treatment if no improvement in 1-2 weeks. RTC if symptoms worsen.  Marco Collie, PA-C  Primary Care at [facility] 08/13/2017 9:23 AM

## 2017-08-15 DIAGNOSIS — H524 Presbyopia: Secondary | ICD-10-CM | POA: Diagnosis not present

## 2017-08-16 ENCOUNTER — Encounter: Payer: Self-pay | Admitting: Physician Assistant

## 2017-08-16 LAB — CULTURE, GROUP A STREP: Strep A Culture: NEGATIVE

## 2018-01-06 ENCOUNTER — Encounter: Payer: Self-pay | Admitting: Physician Assistant

## 2018-01-06 ENCOUNTER — Other Ambulatory Visit: Payer: Self-pay

## 2018-01-06 ENCOUNTER — Ambulatory Visit: Payer: BLUE CROSS/BLUE SHIELD | Admitting: Physician Assistant

## 2018-01-06 VITALS — BP 132/76 | HR 70 | Temp 99.0°F | Resp 16 | Ht 63.5 in | Wt 163.2 lb

## 2018-01-06 DIAGNOSIS — R058 Other specified cough: Secondary | ICD-10-CM

## 2018-01-06 DIAGNOSIS — R05 Cough: Secondary | ICD-10-CM | POA: Diagnosis not present

## 2018-01-06 MED ORDER — PREDNISONE 20 MG PO TABS: ORAL_TABLET | ORAL | 0 refills | Status: DC

## 2018-01-06 NOTE — Patient Instructions (Addendum)
Start taking Prednisone: Take 3 pills in the morning (with food) x2days, then take 2 pills in the morning (with food) x2days, then take 1 pill in the morning (with food) x2days  Start using your flonase for the next 2 weeks.   Stay well hydrated. Get lost of rest. Wash your hands often.   -Foods that can help speed recovery: honey, garlic, chicken soup, elderberries, green tea.  -Supplements that can help speed recovery: vitamin C, zinc, elderberry extract, quercetin, ginseng, selenium -Supplement with prebiotics and probiotics:   Advil or ibuprofen for pain. Do not take Aspirin.  Drink enough water and fluids to keep your urine clear or pale yellow.  For sore throat: ? Gargle with 8 oz of salt water ( tsp of salt per 1 qt of water) as often as every 1-2 hours to soothe your throat.  Gargle liquid benadryl.  Cepacol throat lozenges (if you are not at risk for choking).  For sore throat try using a honey-based tea. Use 3 teaspoons of honey with juice squeezed from half lemon. Place shaved pieces of ginger into 1/2-1 cup of water and warm over stove top. Then mix the ingredients and repeat every 4 hours as needed.  Cough Syrup Recipe: Sweet Lemon & Honey Thyme  Ingredients a handful of fresh thyme sprigs   1 pint of water (2 cups)  1/2 cup honey (raw is best, but regular will do)  1/2 lemon chopped Instructions 1. Place the lemon in the pint jar and cover with the honey. The honey will macerate the lemons and draw out liquids which taste so delicious! 2. Meanwhile, toss the thyme leaves into a saucepan and cover them with the water. 3. Bring the water to a gentle simmer and reduce it to half, about a cup of tea. 4. When the tea is reduced and cooled a bit, strain the sprigs & leaves, add it into the pint jar and stir it well. 5. Give it a shake and use a spoonful as needed. 6. Store your homemade cough syrup in the refrigerator for about a month.  What causes a cough? In adults,  common causes of a cough include: ?An infection of the airways or lungs (such as the common cold) ?Postnasal drip - Postnasal drip is when mucus from the nose drips down or flows along the back of the throat. Postnasal drip can happen when people have: .A cold .Allergies .A sinus infection - The sinuses are hollow areas in the bones of the face that open into the nose. ?Lung conditions, like asthma and chronic obstructive pulmonary disease (COPD) - Both of these conditions can make it hard to breathe. COPD is usually caused by smoking. ?Acid reflux - Acid reflux is when the acid that is normally in your stomach backs up into your esophagus (the tube that carries food from your mouth to your stomach). ?A side effect from blood pressure medicines called "ACE inhibitors" ?Smoking cigarettes  Is there anything I can do on my own to get rid of my cough? Yes. To help get rid of your cough, you can: ?Use a humidifier in your bedroom ?Use an over-the-counter cough medicine, or suck on cough drops or hard candy ?Stop smoking, if you smoke ?If you have allergies, avoid the things you are allergic to (like pollen, dust, animals, or mold) If you have acid reflux, your doctor or nurse will tell you which lifestyle changes can help reduce symptoms.     Thank you for coming in  today. I hope you feel we met your needs.  Feel free to call PCP if you have any questions or further requests.  Please consider signing up for MyChart if you do not already have it, as this is a great way to communicate with me.  Best,  Whitney McVey, PA-C  IF you received an x-ray today, you will receive an invoice from [facility]. Please contact [facility] at [phone] with questions or concerns regarding your invoice.   IF you received labwork today, you will receive an invoice from [place]. Please contact [facility] at [phone] with questions or concerns regarding your invoice.   Our billing  staff will not be able to assist you with questions regarding bills from these companies.  You will be contacted with the lab results as soon as they are available. The fastest way to get your results is to activate your My Chart account. Instructions are located on the last page of this paperwork. If you have not heard from [place] regarding the results in 2 weeks, please contact this office.

## 2018-01-06 NOTE — Progress Notes (Signed)
   Ariana ReichmannSilvia [name]  MRN: [id number] DOB: [birth year]  PCP: Patient, No Pcp Per  Subjective:  Pt is a 52 year old female who presents to clinic for nonproductive cough with chest congestion x 1 week.  "Feels like I need to cough something up but I cannot" She took Mucinex earlier this week with no relief. She has a history of seasonal allergies and does not currently take anything for this. Denies fever, chills, diaphoresis, fatigue, headache, lethargy, sneezing.  Review of Systems  Constitutional: Negative for chills, diaphoresis, fatigue and fever.  HENT: Positive for congestion. Negative for postnasal drip, rhinorrhea, sinus pressure, sinus pain and sore throat.   Respiratory: Positive for cough. Negative for shortness of breath and wheezing.   Psychiatric/Behavioral: Negative for sleep disturbance.    Patient Active Problem List   Diagnosis Date Noted  . Iron deficiency anemia, unspecified 09/03/2013    Current Outpatient Medications on File Prior to Visit  Medication Sig Dispense Refill  . fluticasone (FLONASE) 50 MCG/ACT nasal spray Place 2 sprays into both nostrils daily. 16 g 6  . loratadine-pseudoephedrine (CLARITIN-D 12 HOUR) 5-120 MG tablet Take 1 tablet by mouth 2 (two) times daily. 30 tablet 0   No current facility-administered medications on file prior to visit.     No Known Allergies   Objective:  BP 132/76 (BP Location: Right Arm)   Pulse 70   Temp 99 F (37.2 C) (Oral)   Resp 16   Ht 5' 3.5" (1.613 m)   Wt 163 lb 3.2 oz (74 kg)   SpO2 97%   BMI 28.46 kg/m   Physical Exam  Constitutional: She is oriented to person, place, and time. No distress.  HENT:  Right Ear: Tympanic membrane normal.  Left Ear: Tympanic membrane normal.  Nose: Mucosal edema present. No rhinorrhea. Right sinus exhibits no maxillary sinus tenderness and no frontal sinus tenderness. Left sinus exhibits no maxillary sinus tenderness and no frontal sinus tenderness.  Mouth/Throat:  Oropharynx is clear and moist and mucous membranes are normal.  Cobblestoning of oropharynx  Cardiovascular: Normal rate, regular rhythm and normal heart sounds.  Pulmonary/Chest: Effort normal and breath sounds normal. No respiratory distress. She has no wheezes. She has no rales.  Neurological: She is alert and oriented to person, place, and time.  Skin: Skin is warm and dry.  Psychiatric: Judgment normal.  Vitals reviewed.   Assessment and Plan :  1. Upper airway cough syndrome - Patient presents for cough for the past week. Vital signs are stable.  HPI and physical exam indicate upper airway cough syndrome.  Plan to treat with oral prednisone.  Return to clinic in 5 to 7 days if no improvement.  Discussed seasonal allergy treatment. - predniSONE (DELTASONE) 20 MG tablet; Take 3 PO QAM x2days, 2 PO QAM x2days, 1 PO QAM x2days  Dispense: 12 tablet; Refill: 0   Whitney Donalda Job, PA-C  [facility] at [facility] 01/06/2018 5:17 PM  Please note: Portions of this report may have been transcribed using dragon voice recognition software. Every effort was made to ensure accuracy; however, inadvertent computerized transcription errors may be present.

## 2018-03-28 DIAGNOSIS — Z23 Encounter for immunization: Secondary | ICD-10-CM | POA: Diagnosis not present

## 2018-06-19 ENCOUNTER — Other Ambulatory Visit: Payer: Self-pay | Admitting: Physician Assistant

## 2018-06-19 DIAGNOSIS — Z1231 Encounter for screening mammogram for malignant neoplasm of breast: Secondary | ICD-10-CM

## 2018-07-17 ENCOUNTER — Ambulatory Visit
Admission: RE | Admit: 2018-07-17 | Discharge: 2018-07-17 | Disposition: A | Discharge status: Home / Self Care | Payer: BLUE CROSS/BLUE SHIELD | Source: Ambulatory Visit | Attending: Physician Assistant | Admitting: Physician Assistant

## 2018-07-17 DIAGNOSIS — Z1231 Encounter for screening mammogram for malignant neoplasm of breast: Secondary | ICD-10-CM | POA: Diagnosis not present

## 2018-09-13 ENCOUNTER — Other Ambulatory Visit: Payer: Self-pay

## 2018-09-13 ENCOUNTER — Telehealth (INDEPENDENT_AMBULATORY_CARE_PROVIDER_SITE_OTHER): Payer: BLUE CROSS/BLUE SHIELD | Admitting: Family Medicine

## 2018-09-13 DIAGNOSIS — M25512 Pain in left shoulder: Secondary | ICD-10-CM | POA: Diagnosis not present

## 2018-09-13 DIAGNOSIS — N952 Postmenopausal atrophic vaginitis: Secondary | ICD-10-CM

## 2018-09-13 MED ORDER — ESTROGENS, CONJUGATED 0.625 MG/GM VA CREA: 1.0000 | TOPICAL_CREAM | Freq: Every day | VAGINAL | 12 refills | Status: DC

## 2018-09-13 NOTE — Progress Notes (Signed)
Virtual Visit via Telephone Note  I connected with Ariana ReichmannSilvia [name] on 09/13/18 at 9:55 AM by telephone and verified that I am speaking with the correct person using two identifiers.   I discussed the limitations, risks, security and privacy concerns of performing an evaluation and management service by telephone and the availability of in person appointments. I also discussed with the patient that there may be a patient responsible charge related to this service. The patient expressed understanding and agreed to proceed, consent obtained.   Chief complaint: menopause.  History of Present Illness:  Menopausal symptoms: Has noticed vaginal dryness and discomfort with intercourse past month or two. Tried KY and other otc treatments.  Menses stopped in March 2018.  No hot flushes, or other menopausal symptoms.  No personal hx uterine or breast CA - mom had breast CA.  No personal hx of DVT.   Left shoulder pain: NKI.  Few weeks, sore to move, but has intact ROM.  Overhead lifting sore. Overall is getting better.  Tx: tylenol - once per day, some help, just not resolved.     Patient Active Problem List   Diagnosis Date Noted  . Iron deficiency anemia, unspecified 09/03/2013   Past Medical History:  Diagnosis Date  . Anemia    No past surgical history on file. No Known Allergies Prior to Admission medications   Medication Sig Start Date End Date Taking? Authorizing Provider  fluticasone (FLONASE) 50 MCG/ACT nasal spray Place 2 sprays into both nostrils daily. 08/13/17  Yes McVey, Madelaine BhatElizabeth Whitney, PA-C  loratadine-pseudoephedrine (CLARITIN-D 12 HOUR) 5-120 MG tablet Take 1 tablet by mouth 2 (two) times daily. 08/13/17  Yes McVey, Madelaine BhatElizabeth Whitney, PA-C   Social History   Socioeconomic History  . Marital status: Married    Spouse name: Not on file  . Number of children: 3  . Years of education: Not on file  . Highest education level: Not on file  Occupational History  .  Occupation: Psychologist, educationalQuality Control    Employer: [employer]  Social Needs  . Financial resource strain: Not on file  . Food insecurity:    Worry: Not on file    Inability: Not on file  . Transportation needs:    Medical: Not on file    Non-medical: Not on file  Tobacco Use  . Smoking status: Never Smoker  . Smokeless tobacco: Never Used  Substance and Sexual Activity  . Alcohol use: No  . Drug use: No  . Sexual activity: Not on file  Lifestyle  . Physical activity:    Days per week: Not on file    Minutes per session: Not on file  . Stress: Not on file  Relationships  . Social connections:    Talks on phone: Not on file    Gets together: Not on file    Attends religious service: Not on file    Active member of club or organization: Not on file    Attends meetings of clubs or organizations: Not on file    Relationship status: Not on file  . Intimate partner violence:    Fear of current or ex partner: Not on file    Emotionally abused: Not on file    Physically abused: Not on file    Forced sexual activity: Not on file  Other Topics Concern  . Not on file  Social History Narrative  . Not on file     Observations/Objective: No distress on phone.   Assessment and Plan:  Acute pain of left shoulder  -Range of motion reportedly intact.  Reports improvement of symptoms.  Decided on continued home care at this time with symptomatic care and RTC precautions if persistent or worsening pain.  Postmenopausal atrophic vaginitis - Plan: conjugated estrogens (PREMARIN) vaginal cream  -Start estrogen vaginal cream as denies other postmenopausal symptoms including hot flushes.  Initially daily, then 1-3 times per week with lowest effective dose.  Follow Up Instructions:   Patient Instructions   Try topical vaginal estrogen - initially can use daily, then after 1-2 weeks decrease to 1-3 times per week (lowest effective dose). You can also supplement with KY or other personal  lubricant if needed. Let us know how things are going in the next 6 weeks.   See info on shoulder pain below.  I am glad to hear that is getting better.  Tylenol is okay to take during the day with an occasional Aleve or ibuprofen only if needed.  If does not continue to improve the next few weeks, or at any point getting worse, please schedule another visit to determine next step.   Shoulder Pain Many things can cause shoulder pain, including:  An injury to the shoulder.  Overuse of the shoulder.  Arthritis. The source of the pain can be:  Inflammation.  An injury to the shoulder joint.  An injury to a tendon, ligament, or bone. Follow these instructions at home: Pay attention to changes in your symptoms. Let your health care provider know about them. Follow these instructions to relieve your pain. If you have a sling:  Wear the sling as told by your health care provider. Remove it only as told by your health care provider.  Loosen the sling if your fingers tingle, become numb, or turn cold and blue.  Keep the sling clean.  If the sling is not waterproof: ? Do not let it get wet. Remove it to shower or bathe.  Move your arm as little as possible, but keep your hand moving to prevent swelling. Managing pain, stiffness, and swelling   If directed, put ice on the painful area: ? Put ice in a plastic bag. ? Place a towel between your skin and the bag. ? Leave the ice on for 20 minutes, 2-3 times per day. Stop applying ice if it does not help with the pain.  Squeeze a soft ball or a foam pad as much as possible. This helps to keep the shoulder from swelling. It also helps to strengthen the arm. General instructions  Take over-the-counter and prescription medicines only as told by your health care provider.  Keep all follow-up visits as told by your health care provider. This is important. Contact a health care provider if:  Your pain gets worse.  Your pain is not  relieved with medicines.  New pain develops in your arm, hand, or fingers. Get help right away if:  Your arm, hand, or fingers: ? Tingle. ? Become numb. ? Become swollen. ? Become painful. ? Turn white or blue. Summary  Shoulder pain can be caused by an injury, overuse, or arthritis.  Pay attention to changes in your symptoms. Let your health care provider know about them.  This condition may be treated with a sling, ice, and pain medicines.  Contact your health care provider if the pain gets worse or new pain develops. Get help right away if your arm, hand, or fingers tingle or become numb, swollen, or painful.  Keep all follow-up visits as told  by your health care provider. This is important. This information is not intended to replace advice given to you by your health care provider. Make sure you discuss any questions you have with your health care provider. Document Released: 03/03/2005 Document Revised: 12/06/2017 Document Reviewed: 12/06/2017 Elsevier Interactive Patient Education  2019 [employer]   Atrophic Vaginitis  Atrophic vaginitis is a condition in which the tissues that line the vagina become dry and thin. This condition is most common in women who have stopped having regular menstrual periods (are in menopause). This usually starts when a woman is 7-40 years old. That is the time when a woman's estrogen levels begin to drop (decrease). Estrogen is a female hormone. It helps to keep the tissues of the vagina moist. It stimulates the vagina to produce a clear fluid that lubricates the vagina for sexual intercourse. This fluid also protects the vagina from infection. Lack of estrogen can cause the lining of the vagina to get thinner and dryer. The vagina may also shrink in size. It may become less elastic. Atrophic vaginitis tends to get worse over time as a woman's estrogen level drops. What are the causes? This condition is caused by the normal drop in estrogen  that happens around the time of menopause. What increases the risk? Certain conditions or situations may lower a woman's estrogen level, leading to a higher risk for atrophic vaginitis. You are more likely to develop this condition if:  You are taking medicines that block estrogen.  You have had your ovaries removed.  You are being treated for cancer with X-ray (radiation) or medicines (chemotherapy).  You have given birth or are breastfeeding.  You are older than age 28.  You smoke. What are the signs or symptoms? Symptoms of this condition include:  Pain, soreness, or bleeding during sexual intercourse (dyspareunia).  Vaginal burning, irritation, or itching.  Pain or bleeding when a speculum is used in a vaginal exam (pelvic exam).  Having burning pain when passing urine.  Vaginal discharge that is brown or yellow. In some cases, there are no symptoms. How is this diagnosed? This condition is diagnosed by taking a medical history and doing a physical exam. This will include a pelvic exam that checks the vaginal tissues. Though rare, you may also have other tests, including:  A urine test.  A test that checks the acid balance in your vagina (acid balance test). How is this treated? Treatment for this condition depends on how severe your symptoms are. Treatment may include:  Using an over-the-counter vaginal lubricant before sex.  Using a long-acting vaginal moisturizer.  Using low-dose vaginal estrogen for moderate to severe symptoms that do not respond to other treatments. Options include creams, tablets, and inserts (vaginal rings). Before you use a vaginal estrogen, tell your health care provider if you have a history of: ? Breast cancer. ? Endometrial cancer. ? Blood clots. If you are not sexually active and your symptoms are very mild, you may not need treatment. Follow these instructions at home: Medicines  Take over-the-counter and prescription medicines only  as told by your health care provider. Do not use herbal or alternative medicines unless your health care provider says that you can.  Use over-the-counter creams, lubricants, or moisturizers for dryness only as directed by your health care provider. General instructions  If your atrophic vaginitis is caused by menopause, discuss all of your menopause symptoms and treatment options with your health care provider.  Do not douche.  Do not  use products that can make your vagina dry. These include: ? Scented feminine sprays. ? Scented tampons. ? Scented soaps.  Vaginal intercourse can help to improve blood flow and elasticity of vaginal tissue. If it hurts to have sex, try using a lubricant or moisturizer just before having intercourse. Contact a health care provider if:  Your discharge looks different than normal.  Your vagina has an unusual smell.  You have new symptoms.  Your symptoms do not improve with treatment.  Your symptoms get worse. Summary  Atrophic vaginitis is a condition in which the tissues that line the vagina become dry and thin. It is most common in women who have stopped having regular menstrual periods (are in menopause).  Treatment options include using vaginal lubricants and low-dose vaginal estrogen.  Contact a health care provider if your vagina has an unusual smell, or if your symptoms get worse or do not improve after treatment. This information is not intended to replace advice given to you by your health care provider. Make sure you discuss any questions you have with your health care provider. Document Released: 10/08/2014 Document Revised: 02/17/2017 Document Reviewed: 02/17/2017 Elsevier Interactive Patient Education  2019 [employer].    I discussed the assessment and treatment plan with the patient. The patient was provided an opportunity to ask questions and all were answered. The patient agreed with the plan and demonstrated an understanding of  the instructions.   The patient was advised to call back or seek an in-person evaluation if the symptoms worsen or if the condition fails to improve as anticipated.  I provided 11 minutes of non-face-to-face time during this encounter.  Signed,   Meredith Staggers, MD [facility] at [facility].  09/13/18

## 2018-09-13 NOTE — Patient Instructions (Addendum)
Try topical vaginal estrogen - initially can use daily, then after 1-2 weeks decrease to 1-3 times per week (lowest effective dose). You can also supplement with KY or other personal lubricant if needed. Let us know how things are going in the next 6 weeks.   See info on shoulder pain below.  I am glad to hear that is getting better.  Tylenol is okay to take during the day with an occasional Aleve or ibuprofen only if needed.  If does not continue to improve the next few weeks, or at any point getting worse, please schedule another visit to determine next step.   Shoulder Pain Many things can cause shoulder pain, including:  An injury to the shoulder.  Overuse of the shoulder.  Arthritis. The source of the pain can be:  Inflammation.  An injury to the shoulder joint.  An injury to a tendon, ligament, or bone. Follow these instructions at home: Pay attention to changes in your symptoms. Let your health care provider know about them. Follow these instructions to relieve your pain. If you have a sling:  Wear the sling as told by your health care provider. Remove it only as told by your health care provider.  Loosen the sling if your fingers tingle, become numb, or turn cold and blue.  Keep the sling clean.  If the sling is not waterproof: ? Do not let it get wet. Remove it to shower or bathe.  Move your arm as little as possible, but keep your hand moving to prevent swelling. Managing pain, stiffness, and swelling   If directed, put ice on the painful area: ? Put ice in a plastic bag. ? Place a towel between your skin and the bag. ? Leave the ice on for 20 minutes, 2-3 times per day. Stop applying ice if it does not help with the pain.  Squeeze a soft ball or a foam pad as much as possible. This helps to keep the shoulder from swelling. It also helps to strengthen the arm. General instructions  Take over-the-counter and prescription medicines only as told by your health  care provider.  Keep all follow-up visits as told by your health care provider. This is important. Contact a health care provider if:  Your pain gets worse.  Your pain is not relieved with medicines.  New pain develops in your arm, hand, or fingers. Get help right away if:  Your arm, hand, or fingers: ? Tingle. ? Become numb. ? Become swollen. ? Become painful. ? Turn white or blue. Summary  Shoulder pain can be caused by an injury, overuse, or arthritis.  Pay attention to changes in your symptoms. Let your health care provider know about them.  This condition may be treated with a sling, ice, and pain medicines.  Contact your health care provider if the pain gets worse or new pain develops. Get help right away if your arm, hand, or fingers tingle or become numb, swollen, or painful.  Keep all follow-up visits as told by your health care provider. This is important. This information is not intended to replace advice given to you by your health care provider. Make sure you discuss any questions you have with your health care provider. Document Released: 03/03/2005 Document Revised: 12/06/2017 Document Reviewed: 12/06/2017 Elsevier Interactive Patient Education  2019 [employer] Inc.   Atrophic Vaginitis  Atrophic vaginitis is a condition in which the tissues that line the vagina become dry and thin. This condition is most common in women  who have stopped having regular menstrual periods (are in menopause). This usually starts when a woman is 86-34 years old. That is the time when a woman's estrogen levels begin to drop (decrease). Estrogen is a female hormone. It helps to keep the tissues of the vagina moist. It stimulates the vagina to produce a clear fluid that lubricates the vagina for sexual intercourse. This fluid also protects the vagina from infection. Lack of estrogen can cause the lining of the vagina to get thinner and dryer. The vagina may also shrink in size. It may  become less elastic. Atrophic vaginitis tends to get worse over time as a woman's estrogen level drops. What are the causes? This condition is caused by the normal drop in estrogen that happens around the time of menopause. What increases the risk? Certain conditions or situations may lower a woman's estrogen level, leading to a higher risk for atrophic vaginitis. You are more likely to develop this condition if:  You are taking medicines that block estrogen.  You have had your ovaries removed.  You are being treated for cancer with X-ray (radiation) or medicines (chemotherapy).  You have given birth or are breastfeeding.  You are older than age 43.  You smoke. What are the signs or symptoms? Symptoms of this condition include:  Pain, soreness, or bleeding during sexual intercourse (dyspareunia).  Vaginal burning, irritation, or itching.  Pain or bleeding when a speculum is used in a vaginal exam (pelvic exam).  Having burning pain when passing urine.  Vaginal discharge that is brown or yellow. In some cases, there are no symptoms. How is this diagnosed? This condition is diagnosed by taking a medical history and doing a physical exam. This will include a pelvic exam that checks the vaginal tissues. Though rare, you may also have other tests, including:  A urine test.  A test that checks the acid balance in your vagina (acid balance test). How is this treated? Treatment for this condition depends on how severe your symptoms are. Treatment may include:  Using an over-the-counter vaginal lubricant before sex.  Using a long-acting vaginal moisturizer.  Using low-dose vaginal estrogen for moderate to severe symptoms that do not respond to other treatments. Options include creams, tablets, and inserts (vaginal rings). Before you use a vaginal estrogen, tell your health care provider if you have a history of: ? Breast cancer. ? Endometrial cancer. ? Blood clots. If you are  not sexually active and your symptoms are very mild, you may not need treatment. Follow these instructions at home: Medicines  Take over-the-counter and prescription medicines only as told by your health care provider. Do not use herbal or alternative medicines unless your health care provider says that you can.  Use over-the-counter creams, lubricants, or moisturizers for dryness only as directed by your health care provider. General instructions  If your atrophic vaginitis is caused by menopause, discuss all of your menopause symptoms and treatment options with your health care provider.  Do not douche.  Do not use products that can make your vagina dry. These include: ? Scented feminine sprays. ? Scented tampons. ? Scented soaps.  Vaginal intercourse can help to improve blood flow and elasticity of vaginal tissue. If it hurts to have sex, try using a lubricant or moisturizer just before having intercourse. Contact a health care provider if:  Your discharge looks different than normal.  Your vagina has an unusual smell.  You have new symptoms.  Your symptoms do not improve with treatment.  Your symptoms get worse. Summary  Atrophic vaginitis is a condition in which the tissues that line the vagina become dry and thin. It is most common in women who have stopped having regular menstrual periods (are in menopause).  Treatment options include using vaginal lubricants and low-dose vaginal estrogen.  Contact a health care provider if your vagina has an unusual smell, or if your symptoms get worse or do not improve after treatment. This information is not intended to replace advice given to you by your health care provider. Make sure you discuss any questions you have with your health care provider. Document Released: 10/08/2014 Document Revised: 02/17/2017 Document Reviewed: 02/17/2017 Elsevier Interactive Patient Education  2019 [employer].

## 2018-09-13 NOTE — Progress Notes (Signed)
Pt CC today is menopause issues for x 2 months. She states she would like recombinations and suggestions to manage this condition. She states it has became an issues with sexual intercourse because of Dryness and pain. Also she has been having shoulder pain x 1 month which she thinks its muscular.

## 2018-12-04 DIAGNOSIS — Z20828 Contact with and (suspected) exposure to other viral communicable diseases: Secondary | ICD-10-CM | POA: Diagnosis not present

## 2019-04-08 DIAGNOSIS — H524 Presbyopia: Secondary | ICD-10-CM | POA: Diagnosis not present

## 2019-04-25 ENCOUNTER — Other Ambulatory Visit: Payer: Self-pay | Admitting: Physician Assistant

## 2019-04-25 DIAGNOSIS — Z1231 Encounter for screening mammogram for malignant neoplasm of breast: Secondary | ICD-10-CM

## 2019-05-16 ENCOUNTER — Other Ambulatory Visit: Payer: Self-pay

## 2019-05-16 ENCOUNTER — Ambulatory Visit (INDEPENDENT_AMBULATORY_CARE_PROVIDER_SITE_OTHER): Payer: BLUE CROSS/BLUE SHIELD

## 2019-05-16 ENCOUNTER — Encounter: Payer: BLUE CROSS/BLUE SHIELD | Admitting: Adult Health Nurse Practitioner

## 2019-05-16 ENCOUNTER — Ambulatory Visit: Payer: BLUE CROSS/BLUE SHIELD | Admitting: Adult Health Nurse Practitioner

## 2019-05-16 ENCOUNTER — Encounter: Payer: Self-pay | Admitting: Adult Health Nurse Practitioner

## 2019-05-16 VITALS — BP 160/80 | HR 63 | Temp 98.3°F | Ht 64.0 in | Wt 166.0 lb

## 2019-05-16 DIAGNOSIS — M25529 Pain in unspecified elbow: Secondary | ICD-10-CM

## 2019-05-16 DIAGNOSIS — S43109D Unspecified dislocation of unspecified acromioclavicular joint, subsequent encounter: Secondary | ICD-10-CM

## 2019-05-16 DIAGNOSIS — M249 Joint derangement, unspecified: Secondary | ICD-10-CM | POA: Diagnosis not present

## 2019-05-16 DIAGNOSIS — M19012 Primary osteoarthritis, left shoulder: Secondary | ICD-10-CM | POA: Diagnosis not present

## 2019-05-16 DIAGNOSIS — S43109A Unspecified dislocation of unspecified acromioclavicular joint, initial encounter: Secondary | ICD-10-CM | POA: Diagnosis not present

## 2019-05-16 DIAGNOSIS — M25412 Effusion, left shoulder: Secondary | ICD-10-CM

## 2019-05-16 MED ORDER — METHYLPREDNISOLONE 4 MG PO TBPK: ORAL_TABLET | ORAL | 0 refills | Status: DC

## 2019-05-16 MED ORDER — TRAMADOL HCL 50 MG PO TABS: 50.0000 mg | ORAL_TABLET | Freq: Three times a day (TID) | ORAL | 0 refills | Status: AC | PRN

## 2019-05-16 NOTE — Patient Instructions (Signed)
° ° ° °  If you have lab work done today you will be contacted with your lab results within the next 2 weeks.  If you have not heard from us then please contact us. The fastest way to get your results is to register for My Chart. ° ° °IF you received an x-ray today, you will receive an invoice from [facility]. Please contact [facility] at [phone] with questions or concerns regarding your invoice.  ° °IF you received labwork today, you will receive an invoice from [facility]. Please contact [facility] at [phone] with questions or concerns regarding your invoice.  ° °Our billing staff will not be able to assist you with questions regarding bills from these companies. ° °You will be contacted with the lab results as soon as they are available. The fastest way to get your results is to activate your My Chart account. Instructions are located on the last page of this paperwork. If you have not heard from us regarding the results in 2 weeks, please contact this office. °  ° ° ° °

## 2019-05-21 ENCOUNTER — Encounter: Payer: Self-pay | Admitting: Family Medicine

## 2019-05-21 ENCOUNTER — Ambulatory Visit (INDEPENDENT_AMBULATORY_CARE_PROVIDER_SITE_OTHER): Payer: BLUE CROSS/BLUE SHIELD | Admitting: Family Medicine

## 2019-05-21 ENCOUNTER — Other Ambulatory Visit: Payer: Self-pay

## 2019-05-21 DIAGNOSIS — M7542 Impingement syndrome of left shoulder: Secondary | ICD-10-CM

## 2019-05-21 NOTE — Progress Notes (Signed)
Ariana [name] - 53 y.o. female MRN [id number]  Date of birth: 01/14/1966  Office Visit Note: Visit Date: 05/21/2019 PCP: Wendall Mola, NP Referred by: Wendall Mola, NP  Subjective: Chief Complaint  Patient presents with  . Left Shoulder - Pain    Anterior shoulder pain x 2-3 weeks. NKI, but h/o repetitive arm movements on a previous job. No problems with ROM, but does have pain with certain movements. Right-hand dominant.   HPI: Ariana [name] is a 53 y.o. female who comes in today with left shoulder pain for the past 2-3 weeks. 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 She reports pain in anterior shoulder when she tries to lift her arm above 90 degrees. She had similar pain in March that improved with tylenol but this time tylenol/ibuprofen is not helping. She is right handed.   ROS Otherwise per HPI.  Assessment & Plan: Visit Diagnoses:  1. Impingement syndrome of left shoulder      Plan: Elected for subacromial subacromial injection for pain relief. Provided rotator cuff strengthening exercises as well. Return as needed.   Follow-up: PRN  Procedures: Procedure performed: subacromial corticosteroid injection; landmark guided   Noted no overlying erythema, induration, or other signs of local infection. The left posterior subacromial space was palpated and marked. The overlying skin was prepped in a sterile fashion. Topical analgesic spray: Ethyl chloride. Joint: left subacromial Needle: 25 gauge, 1.5 inch Completed without difficulty. Meds: 3 cc 1% lidocaine without epinephrine, 40 mg depo-medrol   Clinical History: No specialty comments available.   She reports that she has never smoked. She has never used smokeless tobacco. No results for input(s): HGBA1C, LABURIC in the last 8760 hours.  Objective:  VS:  HT:    WT:   BMI:     BP:   HR: bpm   TEMP: ( )  RESP:  Physical Exam  PHYSICAL EXAM: Gen: NAD, alert, cooperative with exam, well-appearing HEENT: clear conjunctiva,  CV:  no edema, capillary refill brisk, normal rate Resp: non-labored Skin: no rashes, normal turgor  Neuro: no gross deficits.  Psych:  alert and oriented  Ortho Exam  Shoulder: Inspection reveals no obvious deformity, atrophy, or asymmetry. No bruising. No swelling TTP over anterior shoulder, superior to bicipital groove. No TTP over [facility] joint  Full ROM in flexion, abduction, internal/external rotation NV intact distally Normal scapular function observed. Special Tests:  - Impingement: positive Hawkins, neers - Supraspinatous: Negative empty can.  5/5 strength with resisted flexion at 20 degrees - Infraspinatous/Teres Minor: 5/5 strength with ER - Subscapularis: 5/5 strength with IR - Biceps tendon: Negative Speeds, Yerrgason's  - Labrum: Negative Obriens, negative clunk, good stability - AC Joint: Negative cross arm  Imaging:  x-ray from 12/9 showed left calcific tendinitis. Mild degenerative changes in [facility] joint Past Medical/Family/Surgical/Social History: Medications & Allergies reviewed per EMR, new medications updated. Patient Active Problem List   Diagnosis Date Noted  . Iron deficiency anemia, unspecified 09/03/2013   Past Medical History:  Diagnosis Date  . Anemia    Family History  Problem Relation Age of Onset  . Cancer Mother   . Hypertension Mother   . Breast cancer Mother 58  . Hypertension Father   . Colon cancer Neg Hx   . Esophageal cancer Neg Hx   . Rectal cancer Neg Hx   . Stomach cancer Neg Hx  History reviewed. No pertinent surgical history. Social History   Occupational History  . Occupation: Psychologist, educational: [employer]  Tobacco Use  . Smoking status: Never Smoker  . Smokeless tobacco: Never Used  Substance and Sexual Activity  . Alcohol use: No  . Drug use: No  . Sexual activity: Not on  file

## 2019-05-21 NOTE — Progress Notes (Signed)
I saw and examined the patient with Dr. Mayer Masker and agree with assessment and plan as outlined.    Left shoulder impingement symptoms with AC DJD on x-ray, but not significantly tender at [facility] joint today.    Will inject subacromial space.  Return as needed.

## 2019-05-22 ENCOUNTER — Encounter: Payer: Self-pay | Admitting: Adult Health Nurse Practitioner

## 2019-05-22 DIAGNOSIS — M249 Joint derangement, unspecified: Secondary | ICD-10-CM

## 2019-05-22 HISTORY — DX: Joint derangement, unspecified: M24.9

## 2019-05-22 NOTE — Progress Notes (Signed)
Subjective:    Patient ID: Ariana [name], female    DOB: 11-Jul-1965, 53 y.o.   MRN: [id number]  HPI  Shoulder Pain: Patient complaints of left shoulder pain. This is evaluated as a personal injury. The pain is described as aching, dull and throbbing.  The onset of the pain was gradual, starting about 5 months ago.  The pain occurs every day and when active is worsened by increased activity and/or increased lifting  Location is anterior, acromioclavicular joint. Recurrent, no known  episodes of dislocation. Symptoms are aggravated by reaching, lifting, pulling, pushing, carrying, work at or above shoulder height, difficulty sleeping on affected side. Symptoms are diminished by  medication: Steriods used and beneficial, avoiding the painful activities.   Limited activities include: repetitive use, work at or above shoulder height, difficulty sleeping on affected side. mild stiffness, mild weakness is reported. 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     Review of Systems    Review of Systems  Constitutional: Negative for activity change, appetite change, chills and fever.  HENT: Negative for congestion, nosebleeds, trouble swallowing and voice change.   Respiratory: Negative for cough, shortness of breath and wheezing.   Gastrointestinal: Negative for diarrhea, nausea and vomiting.  Genitourinary: Negative for difficulty urinating, dysuria, flank pain and hematuria.  Musculoskeletal ROS: positive for - joint pain, joint stiffness, joint swelling and muscular weakness negative for - gait disturbance or joint swelling Neurological: Negative for dizziness, speech difficulty, light-headedness and numbness.  See HPI. All other review of systems negative.   Objective:   Physical Exam General appearance: alert, well appearing, and in no distress.  Musculoskeletal exam: abnormal exam of left shoulder . A left shoulder exam was performed. GENERAL: no acute distress SKIN:  intact SWELLING: none DEFORMITY: AC joint laxity TENDERNESS: moderate ROM: limited by pain, external rotation at 90 degrees abduction  and internal rotation at 90 degrees abduction  STRENGTH: limited by pain, supraspinatus grade 4+ of 5 and elbow flexion strength grade 4+ of 5limited by pain STABILITY: sulcus sign: 1 - 2 cm and anterior apprehension test positive for pain only SPECIAL TESTS: Luan Pulling' test: positive, mild and Cross-chest abduction: pain at [facility] joint NEUROLOGICAL EXAM: normal and normal sensation and reflexes VASCULAR EXAM: normal, radial pulse present and good color & capillary refill NECK: supple, tender no and ROM: normal  Shoulder exam shows positive impingement signs are present with pain at high arc of abduction and forward flexion on left. There is tenderness of the AC joint.  .      Assessment & Plan:   1. Arthralgia of upper arm, unspecified laterality   2. AC joint dislocation, unspecified laterality, subsequent encounter   3. Effusion of acromioclavicular joint, left     Orders Placed This Encounter  Procedures  . Large Joint Injection/Arthrocentesis  . DG AC Joints  . Ambulatory referral to Orthopedic Surgery   Meds ordered this encounter  Medications  . methylPREDNISolone (MEDROL DOSEPAK) 4 MG TBPK tablet    Sig: As directed on pkg label    Dispense:  21 each    Refill:  0  . traMADol (ULTRAM) 50 MG tablet    Sig: Take 1 tablet (50 mg total) by mouth every 8 (eight) hours as needed for up to 5 days.    Dispense:  15 tablet    Refill:  0   Prednisone is a steroid medication that has multiple uses. This medication is a potent anti-inflammatory drug. With  short-term use, this medication may cause insomnia, nervousness, fluid retention, and flushing. With long-term use, this medication may result in elevated blood sugars, high blood pressure, thinning of bones and skin, and weight gain. Please contact our office if significant side effects are noted on  this medication.   Elyse Jarvis, NP

## 2019-07-23 ENCOUNTER — Ambulatory Visit
Admission: RE | Admit: 2019-07-23 | Discharge: 2019-07-23 | Disposition: A | Discharge status: Home / Self Care | Payer: BLUE CROSS/BLUE SHIELD | Source: Ambulatory Visit | Attending: Physician Assistant | Admitting: Physician Assistant

## 2019-07-23 ENCOUNTER — Other Ambulatory Visit: Payer: Self-pay

## 2019-07-23 DIAGNOSIS — Z1231 Encounter for screening mammogram for malignant neoplasm of breast: Secondary | ICD-10-CM

## 2019-07-24 ENCOUNTER — Other Ambulatory Visit: Payer: Self-pay | Admitting: Physician Assistant

## 2019-07-24 DIAGNOSIS — R928 Other abnormal and inconclusive findings on diagnostic imaging of breast: Secondary | ICD-10-CM

## 2019-07-30 ENCOUNTER — Ambulatory Visit
Admission: RE | Admit: 2019-07-30 | Discharge: 2019-07-30 | Disposition: A | Discharge status: Home / Self Care | Payer: BLUE CROSS/BLUE SHIELD | Source: Ambulatory Visit | Attending: Physician Assistant | Admitting: Physician Assistant

## 2019-07-30 ENCOUNTER — Other Ambulatory Visit: Payer: Self-pay | Admitting: Adult Health Nurse Practitioner

## 2019-07-30 ENCOUNTER — Other Ambulatory Visit: Payer: Self-pay

## 2019-07-30 ENCOUNTER — Ambulatory Visit: Payer: BLUE CROSS/BLUE SHIELD

## 2019-07-30 DIAGNOSIS — R928 Other abnormal and inconclusive findings on diagnostic imaging of breast: Secondary | ICD-10-CM

## 2019-07-30 DIAGNOSIS — N6489 Other specified disorders of breast: Secondary | ICD-10-CM

## 2019-07-31 ENCOUNTER — Other Ambulatory Visit: Payer: Self-pay | Admitting: Physician Assistant

## 2019-07-31 DIAGNOSIS — N6489 Other specified disorders of breast: Secondary | ICD-10-CM

## 2019-08-06 ENCOUNTER — Other Ambulatory Visit: Payer: Self-pay

## 2019-08-06 ENCOUNTER — Ambulatory Visit
Admission: RE | Admit: 2019-08-06 | Discharge: 2019-08-06 | Disposition: A | Discharge status: Home / Self Care | Payer: BLUE CROSS/BLUE SHIELD | Source: Ambulatory Visit | Attending: Adult Health Nurse Practitioner | Admitting: Adult Health Nurse Practitioner

## 2019-08-06 ENCOUNTER — Other Ambulatory Visit: Payer: Self-pay | Admitting: Physician Assistant

## 2019-08-06 ENCOUNTER — Ambulatory Visit
Admission: RE | Admit: 2019-08-06 | Discharge: 2019-08-06 | Disposition: A | Discharge status: Home / Self Care | Payer: BLUE CROSS/BLUE SHIELD | Source: Ambulatory Visit | Attending: Physician Assistant | Admitting: Physician Assistant

## 2019-08-06 DIAGNOSIS — N6489 Other specified disorders of breast: Secondary | ICD-10-CM

## 2019-08-06 DIAGNOSIS — R928 Other abnormal and inconclusive findings on diagnostic imaging of breast: Secondary | ICD-10-CM | POA: Diagnosis not present

## 2019-08-24 ENCOUNTER — Ambulatory Visit: Payer: BLUE CROSS/BLUE SHIELD | Admitting: Adult Health Nurse Practitioner

## 2019-08-24 ENCOUNTER — Ambulatory Visit: Payer: Self-pay | Admitting: Surgery

## 2019-08-24 ENCOUNTER — Other Ambulatory Visit: Payer: Self-pay

## 2019-08-24 ENCOUNTER — Encounter: Payer: Self-pay | Admitting: Adult Health Nurse Practitioner

## 2019-08-24 VITALS — BP 166/95 | HR 76 | Temp 98.1°F | Ht 64.0 in | Wt 166.2 lb

## 2019-08-24 DIAGNOSIS — N63 Unspecified lump in unspecified breast: Secondary | ICD-10-CM | POA: Diagnosis not present

## 2019-08-24 DIAGNOSIS — N6311 Unspecified lump in the right breast, upper outer quadrant: Secondary | ICD-10-CM

## 2019-08-24 DIAGNOSIS — R252 Cramp and spasm: Secondary | ICD-10-CM | POA: Diagnosis not present

## 2019-08-24 DIAGNOSIS — N6310 Unspecified lump in the right breast, unspecified quadrant: Secondary | ICD-10-CM | POA: Diagnosis not present

## 2019-08-24 MED ORDER — METHOCARBAMOL 500 MG PO TABS: 500.0000 mg | ORAL_TABLET | Freq: Four times a day (QID) | ORAL | 0 refills | Status: AC

## 2019-08-24 MED ORDER — KETOROLAC TROMETHAMINE 60 MG/2ML IM SOLN
60.0000 mg | Freq: Once | INTRAMUSCULAR | Status: AC
  Administered 2019-08-24: 60 mg via INTRAMUSCULAR

## 2019-08-24 MED ORDER — METHYLPREDNISOLONE 4 MG PO TBPK: ORAL_TABLET | ORAL | 0 refills | Status: DC

## 2019-08-24 NOTE — H&P (View-Only) (Signed)
History of Present Illness Wilmon Arms. Javyn Havlin MD; 08/24/2019 12:57 PM) The patient is a 54 year old female who presents with a breast mass. Referred by Benny Lennert PA-C for right breast mass  This is a healthy 54 year old female with a family history of breast cancer in her mother who presents with a right breast mass. This was found on a recent routine screening mammogram. In 2006, she had a biopsy performed in the right breast that showed fibrocystic changes, apocrine metaplasia, focal ductal hyperplasia without atypia and microcalcifications. The most recent area of distortion is in the same region. This was biopsied and revealed a complex sclerosing lesion.  Menarche age 3 First pregnancy age 39 Breast-feed yes Hormones none Menopause 50    CLINICAL DATA: Screening.  EXAM: DIGITAL SCREENING BILATERAL MAMMOGRAM WITH CAD  COMPARISON: Previous exam(s).  ACR Breast Density Category b: There are scattered areas of fibroglandular density.  FINDINGS: In the right breast, a possible asymmetry warrants further evaluation. In the left breast, no findings suspicious for malignancy. Images were processed with CAD.  IMPRESSION: Further evaluation is suggested for possible asymmetry in the right breast.  RECOMMENDATION: Diagnostic mammogram and possibly ultrasound of the right breast. (Code:[id number]-46M)  The patient will be contacted regarding the findings, and additional imaging will be scheduled.  BI-RADS CATEGORY 0: Incomplete. Need additional imaging evaluation and/or prior mammograms for comparison.   Electronically Signed By: Norva Pavlov M.D. On: 07/23/2019 13:49  CLINICAL DATA: Patient was called back from screening mammogram for a possible asymmetry in the lower-inner quadrant of the right breast. Patient had an ultrasound-guided core biopsy of the right breast for in 2006. Fibrocystic changes, apocrine metaplasia, focal ductal hyperplasia without atypia,  columnar alteration and microcalcifications was reported histologically.  EXAM: DIGITAL DIAGNOSTIC UNILATERAL RIGHT MAMMOGRAM WITH CAD AND TOMO  COMPARISON: Previous exam(s).  ACR Breast Density Category b: There are scattered areas of fibroglandular density.  FINDINGS: 3D images of the right breast were performed. The asymmetry in the lower-inner quadrant of the right breast does not persist on the additional views. In the upper-outer quadrant of the right breast where there is a ribbon shaped clip is distortion. There are no malignant type microcalcifications.  Mammographic images were processed with CAD.  IMPRESSION: Distortion in the upper-outer quadrant of the right breast. Distortion may be related to post biopsy change but radial scar or malignancy cannot be excluded.  RECOMMENDATION: Stereotactic biopsy of the distortion in the upper-outer quadrant of the right breast is recommended.  I have discussed the findings and recommendations with the patient. If applicable, a reminder letter will be sent to the patient regarding the next appointment.  BI-RADS CATEGORY 4: Suspicious.   Electronically Signed By: Baird Lyons M.D. On: 07/30/2019 09:37  CLINICAL DATA: 54 year old female with distortion in the upper-outer right breast.  EXAM: RIGHT BREAST STEREOTACTIC CORE NEEDLE BIOPSY  COMPARISON: Previous exams.  FINDINGS: The patient and I discussed the procedure of stereotactic-guided biopsy including benefits and alternatives. We discussed the high likelihood of a successful procedure. We discussed the risks of the procedure including infection, bleeding, tissue injury, clip migration, and inadequate sampling. Informed written consent was given. The usual time out protocol was performed immediately prior to the procedure.  Using sterile technique and 1% Lidocaine as local anesthetic, under stereotactic guidance, a 9 gauge vacuum assisted device was used  to perform core needle biopsy of the distortion at the site of the ribbon shaped biopsy marking clip in the upper-outer right breast using a  superior to inferior approach.  Lesion quadrant: Upper-outer  At the conclusion of the procedure, an X shaped tissue marker clip was deployed into the biopsy cavity. Follow-up 2-view mammogram was performed and dictated separately.  IMPRESSION: Stereotactic-guided biopsy of the distortion in the upper-outer right breast. No apparent complications.  Electronically Signed: By: Jennifer Jarosz M.D. On: 08/06/2019 09:46  ADDENDUM: Pathology revealed COMPLEX SCLEROSING LESION of the RIGHT breast, upper outer quadrant. This was found to be concordant by Dr. Jennifer Jarosz.  Pathology results were discussed with the patient by telephone. The patient reported doing well after the biopsy with tenderness at the site. Post biopsy instructions and care were reviewed and questions were answered. The patient was encouraged to call [facility] for any additional concerns.  Surgical consultation has been arranged with Dr. Sirron Francesconi at [facility] on August 24, 2019.  Pathology results reported by Susan Eaton RN on 08/07/2019.   Electronically Signed By: Jennifer Jarosz M.D. On: 08/07/2019 15:26    Problem List/Past Medical (Breckan Cafiero K. Jhostin Epps, MD; 08/24/2019 12:57 PM) MASS OF RIGHT BREAST ON MAMMOGRAM (N63.10)  Past Surgical History (Chanel Nolan, CMA; 08/24/2019 10:23 AM) Colon Polyp Removal - Colonoscopy  Diagnostic Studies History (Chanel Nolan, CMA; 08/24/2019 10:23 AM) Mammogram within last year Pap Smear 1-5 years ago  Allergies (Chanel Nolan, CMA; 08/24/2019 10:23 AM) No Known Allergies [08/24/2019]: No Known Drug Allergies [08/24/2019]: Allergies Reconciled  Medication History (Chanel Nolan, CMA; 08/24/2019 10:23 AM) No Current Medications Medications Reconciled  Social History  (Chanel Nolan, CMA; 08/24/2019 10:23 AM) Alcohol use Occasional alcohol use. Caffeine use Carbonated beverages, Coffee, Tea. No drug use Tobacco use Never smoker.  Family History (Chanel Nolan, CMA; 08/24/2019 10:23 AM) Breast Cancer Mother. Hypertension Mother.  Pregnancy / Birth History (Chanel Nolan, CMA; 08/24/2019 10:23 AM) Age at menarche 12 years. Gravida 4 Irregular periods Length (months) of breastfeeding 3-6 Maternal age 15-20 Para 3  Other Problems (Dell Hurtubise K. Oretta Berkland, MD; 08/24/2019 12:57 PM) No pertinent past medical history     Review of Systems (Chanel Nolan CMA; 08/24/2019 10:23 AM) General Not Present- Appetite Loss, Chills, Fatigue, Fever, Night Sweats, Weight Gain and Weight Loss. Skin Not Present- Change in Wart/Mole, Dryness, Hives, Jaundice, New Lesions, Non-Healing Wounds, Rash and Ulcer. HEENT Present- Ringing in the Ears. Not Present- Earache, Hearing Loss, Hoarseness, Nose Bleed, Oral Ulcers, Seasonal Allergies, Sinus Pain, Sore Throat, Visual Disturbances, Wears glasses/contact lenses and Yellow Eyes. Respiratory Not Present- Bloody sputum, Chronic Cough, Difficulty Breathing, Snoring and Wheezing. Cardiovascular Not Present- Chest Pain, Difficulty Breathing Lying Down, Leg Cramps, Palpitations, Rapid Heart Rate, Shortness of Breath and Swelling of Extremities. Gastrointestinal Not Present- Abdominal Pain, Bloating, Bloody Stool, Change in Bowel Habits, Chronic diarrhea, Constipation, Difficulty Swallowing, Excessive gas, Gets full quickly at meals, Hemorrhoids, Indigestion, Nausea, Rectal Pain and Vomiting. Female Genitourinary Not Present- Frequency, Nocturia, Painful Urination, Pelvic Pain and Urgency. Neurological Present- Tingling. Not Present- Decreased Memory, Fainting, Headaches, Numbness, Seizures, Tremor, Trouble walking and Weakness. Psychiatric Not Present- Anxiety, Bipolar, Change in Sleep Pattern, Depression, Fearful and Frequent  crying. Endocrine Not Present- Cold Intolerance, Excessive Hunger, Hair Changes, Heat Intolerance, Hot flashes and New Diabetes. Hematology Not Present- Blood Thinners, Easy Bruising, Excessive bleeding, Gland problems, HIV and Persistent Infections.  Vitals (Chanel Nolan CMA; 08/24/2019 10:23 AM) 08/24/2019 10:23 AM Weight: 166 lb Height: 64in Body Surface Area: 1.81 m Body Mass Index: 28.49 kg/m  Temp.: 98.2F  Pulse: 87 (Regular)  BP: 130/80 (Sitting, Left Arm, Standard)          Physical Exam Rodman Key K. Syra Sirmons MD; 08/24/2019 12:58 PM)  The physical exam findings are as follows: Note:Constitutional: WDWN in NAD, conversant, no obvious deformities; Eyes: Pupils equal, round; sclera anicteric; moist conjunctiva; no lid lag HENT: Oral mucosa moist; good dentition Neck: No masses palpated, trachea midline; no thyromegaly Lungs: CTA bilaterally; normal respiratory effort Breasts: symmetric, no palpable masses or lymphadenopathy; no nipple retraction or discharge CV: Regular rate and rhythm; no murmurs; extremities well-perfused with no edema Abd: +bowel sounds, soft, non-tender, no palpable organomegaly; no palpable hernias Musc: Normal gait; no apparent clubbing or cyanosis in extremities Lymphatic: No palpable cervical or axillary lymphadenopathy Skin: Warm, dry; no sign of jaundice Psychiatric - alert and oriented x 4; calm mood and affect    Assessment & Plan Rodman Key K. Abrianna Sidman MD; 08/24/2019 12:52 PM)  MASS OF RIGHT BREAST ON MAMMOGRAM (N63.10) Impression: Complex sclerosing lesion  Current Plans Schedule for Surgery - Right radioactive seed localized lumpectomy. The surgical procedure has been discussed with the patient. Potential risks, benefits, alternative treatments, and expected outcomes have been explained. All of the patient's questions at this time have been answered. The likelihood of reaching the patient's treatment goal is good. The patient  understand the proposed surgical procedure and wishes to proceed.  Imogene Burn. Georgette Dover, MD, [facility] Surgery  General/ Trauma Surgery   08/24/2019 12:59 PM

## 2019-08-24 NOTE — H&P (Signed)
History of Present Illness Ariana [name]. Ariana Merrihew MD; 08/24/2019 12:57 PM) The patient is a 54 year old female who presents with a breast mass. Referred by Ariana Lennert PA-C for right breast mass  This is a healthy 54 year old female with a family history of breast cancer in her mother who presents with a right breast mass. This was found on a recent routine screening mammogram. In 2006, she had a biopsy performed in the right breast that showed fibrocystic changes, apocrine metaplasia, focal ductal hyperplasia without atypia and microcalcifications. The most recent area of distortion is in the same region. This was biopsied and revealed a complex sclerosing lesion.  Menarche age 3 First pregnancy age 39 Breast-feed yes Hormones none Menopause 50    CLINICAL DATA: Screening.  EXAM: DIGITAL SCREENING BILATERAL MAMMOGRAM WITH CAD  COMPARISON: Previous exam(s).  ACR Breast Density Category b: There are scattered areas of fibroglandular density.  FINDINGS: In the right breast, a possible asymmetry warrants further evaluation. In the left breast, no findings suspicious for malignancy. Images were processed with CAD.  IMPRESSION: Further evaluation is suggested for possible asymmetry in the right breast.  RECOMMENDATION: Diagnostic mammogram and possibly ultrasound of the right breast. (Code:[id number])  The patient will be contacted regarding the findings, and additional imaging will be scheduled.  BI-RADS CATEGORY 0: Incomplete. Need additional imaging evaluation and/or prior mammograms for comparison.   Electronically Signed By: Ariana [name] M.D. On: 07/23/2019 13:49  CLINICAL DATA: Patient was called back from screening mammogram for a possible asymmetry in the lower-inner quadrant of the right breast. Patient had an ultrasound-guided core biopsy of the right breast for in 2006. Fibrocystic changes, apocrine metaplasia, focal ductal hyperplasia without atypia,  columnar alteration and microcalcifications was reported histologically.  EXAM: DIGITAL DIAGNOSTIC UNILATERAL RIGHT MAMMOGRAM WITH CAD AND TOMO  COMPARISON: Previous exam(s).  ACR Breast Density Category b: There are scattered areas of fibroglandular density.  FINDINGS: 3D images of the right breast were performed. The asymmetry in the lower-inner quadrant of the right breast does not persist on the additional views. In the upper-outer quadrant of the right breast where there is a ribbon shaped clip is distortion. There are no malignant type microcalcifications.  Mammographic images were processed with CAD.  IMPRESSION: Distortion in the upper-outer quadrant of the right breast. Distortion may be related to post biopsy change but radial scar or malignancy cannot be excluded.  RECOMMENDATION: Stereotactic biopsy of the distortion in the upper-outer quadrant of the right breast is recommended.  I have discussed the findings and recommendations with the patient. If applicable, a reminder letter will be sent to the patient regarding the next appointment.  BI-RADS CATEGORY 4: Suspicious.   Electronically Signed By: Ariana [name] M.D. On: 07/30/2019 09:37  CLINICAL DATA: 54 year old female with distortion in the upper-outer right breast.  EXAM: RIGHT BREAST STEREOTACTIC CORE NEEDLE BIOPSY  COMPARISON: Previous exams.  FINDINGS: The patient and I discussed the procedure of stereotactic-guided biopsy including benefits and alternatives. We discussed the high likelihood of a successful procedure. We discussed the risks of the procedure including infection, bleeding, tissue injury, clip migration, and inadequate sampling. Informed written consent was given. The usual time out protocol was performed immediately prior to the procedure.  Using sterile technique and 1% Lidocaine as local anesthetic, under stereotactic guidance, a 9 gauge vacuum assisted device was used  to perform core needle biopsy of the distortion at the site of the ribbon shaped biopsy marking clip in the upper-outer right breast using a  superior to inferior approach.  Lesion quadrant: Upper-outer  At the conclusion of the procedure, an X shaped tissue marker clip was deployed into the biopsy cavity. Follow-up 2-view mammogram was performed and dictated separately.  IMPRESSION: Stereotactic-guided biopsy of the distortion in the upper-outer right breast. No apparent complications.  Electronically Signed: By: Ariana [name] M.D. On: 08/06/2019 09:46  ADDENDUM: Pathology revealed COMPLEX SCLEROSING LESION of the RIGHT breast, upper outer quadrant. This was found to be concordant by Dr. Edwin [name].  Pathology results were discussed with the patient by telephone. The patient reported doing well after the biopsy with tenderness at the site. Post biopsy instructions and care were reviewed and questions were answered. The patient was encouraged to call [facility] for any additional concerns.  Surgical consultation has been arranged with Dr. Manus [name] at [facility] on August 24, 2019.  Pathology results reported by Ariana Mares RN on 08/07/2019.   Electronically Signed By: Ariana [name] M.D. On: 08/07/2019 15:26    Problem List/Past Medical Ariana Hazard K. Pruitt Taboada, MD; 08/24/2019 12:57 PM) MASS OF RIGHT BREAST ON MAMMOGRAM (N63.10)  Past Surgical History Ariana [name], CMA; 08/24/2019 10:23 AM) Colon Polyp Removal - Colonoscopy  Diagnostic Studies History (Ariana [name], CMA; 08/24/2019 10:23 AM) Mammogram within last year Pap Smear 1-5 years ago  Allergies (Ariana [name], CMA; 08/24/2019 10:23 AM) No Known Allergies [08/24/2019]: No Known Drug Allergies [08/24/2019]: Allergies Reconciled  Medication History (Ariana [name], CMA; 08/24/2019 10:23 AM) No Current Medications Medications Reconciled  Social History  (Ariana [name], CMA; 08/24/2019 10:23 AM) Alcohol use Occasional alcohol use. Caffeine use Carbonated beverages, Coffee, Tea. No drug use Tobacco use Never smoker.  Family History Ariana [name], CMA; 08/24/2019 10:23 AM) Breast Cancer Mother. Hypertension Mother.  Pregnancy / Birth History Ariana [name], CMA; 08/24/2019 10:23 AM) Age at menarche 12 years. Gravida 4 Irregular periods Length (months) of breastfeeding 3-6 Maternal age 48-20 Para 3  Other Problems Ariana [name]. Bryttany Tortorelli, MD; 08/24/2019 12:57 PM) No pertinent past medical history     Review of Systems (Ariana Nolan CMA; 08/24/2019 10:23 AM) General Not Present- Appetite Loss, Chills, Fatigue, Fever, Night Sweats, Weight Gain and Weight Loss. Skin Not Present- Change in Wart/Mole, Dryness, Hives, Jaundice, New Lesions, Non-Healing Wounds, Rash and Ulcer. HEENT Present- Ringing in the Ears. Not Present- Earache, Hearing Loss, Hoarseness, Nose Bleed, Oral Ulcers, Seasonal Allergies, Sinus Pain, Sore Throat, Visual Disturbances, Wears glasses/contact lenses and Yellow Eyes. Respiratory Not Present- Bloody sputum, Chronic Cough, Difficulty Breathing, Snoring and Wheezing. Cardiovascular Not Present- Chest Pain, Difficulty Breathing Lying Down, Leg Cramps, Palpitations, Rapid Heart Rate, Shortness of Breath and Swelling of Extremities. Gastrointestinal Not Present- Abdominal Pain, Bloating, Bloody Stool, Change in Bowel Habits, Chronic diarrhea, Constipation, Difficulty Swallowing, Excessive gas, Gets full quickly at meals, Hemorrhoids, Indigestion, Nausea, Rectal Pain and Vomiting. Female Genitourinary Not Present- Frequency, Nocturia, Painful Urination, Pelvic Pain and Urgency. Neurological Present- Tingling. Not Present- Decreased Memory, Fainting, Headaches, Numbness, Seizures, Tremor, Trouble walking and Weakness. Psychiatric Not Present- Anxiety, Bipolar, Change in Sleep Pattern, Depression, Fearful and Frequent  crying. Endocrine Not Present- Cold Intolerance, Excessive Hunger, Hair Changes, Heat Intolerance, Hot flashes and New Diabetes. Hematology Not Present- Blood Thinners, Easy Bruising, Excessive bleeding, Gland problems, HIV and Persistent Infections.  Vitals (Ariana Nolan CMA; 08/24/2019 10:23 AM) 08/24/2019 10:23 AM Weight: 166 lb Height: 64in Body Surface Area: 1.81 m Body Mass Index: 28.49 kg/m  Temp.: 98.30F  Pulse: 87 (Regular)  BP: 130/80 (Sitting, Left Arm, Standard)  Physical Exam Rodman Key K. Arrian Manson MD; 08/24/2019 12:58 PM)  The physical exam findings are as follows: Note:Constitutional: WDWN in NAD, conversant, no obvious deformities; Eyes: Pupils equal, round; sclera anicteric; moist conjunctiva; no lid lag HENT: Oral mucosa moist; good dentition Neck: No masses palpated, trachea midline; no thyromegaly Lungs: CTA bilaterally; normal respiratory effort Breasts: symmetric, no palpable masses or lymphadenopathy; no nipple retraction or discharge CV: Regular rate and rhythm; no murmurs; extremities well-perfused with no edema Abd: +bowel sounds, soft, non-tender, no palpable organomegaly; no palpable hernias Musc: Normal gait; no apparent clubbing or cyanosis in extremities Lymphatic: No palpable cervical or axillary lymphadenopathy Skin: Warm, dry; no sign of jaundice Psychiatric - alert and oriented x 4; calm mood and affect    Assessment & Plan Rodman Key K. Salle Brandle MD; 08/24/2019 12:52 PM)  MASS OF RIGHT BREAST ON MAMMOGRAM (N63.10) Impression: Complex sclerosing lesion  Current Plans Schedule for Surgery - Right radioactive seed localized lumpectomy. The surgical procedure has been discussed with the patient. Potential risks, benefits, alternative treatments, and expected outcomes have been explained. All of the patient's questions at this time have been answered. The likelihood of reaching the patient's treatment goal is good. The patient  understand the proposed surgical procedure and wishes to proceed.  Imogene Burn. Georgette Dover, MD, [facility]  General/ Trauma Surgery   08/24/2019 12:59 PM

## 2019-08-24 NOTE — Progress Notes (Signed)
08/24/2019  Chosen [name] 05-01-1966 [id number]    08/24/2019  Name: Ariana [name] female MRN:   [id number] Date of Birth: 17-Aug-1965  Chief Complaint:  Chief Complaint  Patient presents with  . Leg Pain    Pt stated that her leg has been hurting prior for months on/off but just yesterday it got worse to the point she had to come and get it check out.    HPI: HPI   Patient presents with a leg cramp that started 2 nights ago to her right posterior thigh.  It now extends down to her ankle with some paresthesia and pain.  Only occurs when sitting in a certain way intermittently but is very painful.  She had no injury to the area.  No history of blood clot.  She states this was the worst muscle cramp of her life and is still very sore.  We did review her most recent mammogram and biopsy which showed a complex sclerosing mass which she is having removed.  She denies any chills, fevers, night sweats.  No feeling of tightness into her right lower extremity.  No erythema.  No warmth.  She has completed 2 of 2 Covid vaccines.  Will be having surgery for her breast very soon.  Past Medical History:  Past Medical History:  Diagnosis Date  . AC joint derangement 05/22/2019  . Anemia     Past Surgical History: History reviewed. No pertinent surgical history.  Past Family History:  Family History  Problem Relation Age of Onset  . Cancer Mother   . Hypertension Mother   . Breast cancer Mother 40  . Hypertension Father   . Colon cancer Neg Hx   . Esophageal cancer Neg Hx   . Rectal cancer Neg Hx   . Stomach cancer Neg Hx     Review of Systems  Constitutional: Negative for activity change, appetite change, chills and fever.  HENT: Negative for congestion, nosebleeds, trouble swallowing and voice change.   Respiratory: Negative for cough, shortness of breath and wheezing.   Gastrointestinal: Negative for diarrhea, nausea and vomiting.  Genitourinary: Negative  for difficulty urinating, dysuria, flank pain and hematuria.  Musculoskeletal: Negative for back pain, joint swelling and neck pain. Positive for pain to right lower extremity  Neurological: Negative for dizziness, speech difficulty, light-headedness, + for paresthesia in an S1 distribution  See HPI. All other review of systems negative.    General appearance: alert, well appearing, and in no distress, oriented to person, place, and time and anxious. Chest: clear to auscultation, no wheezes, rales or rhonchi, symmetric air entry.  CVS exam: normal rate, regular rhythm, normal S1, S2, no murmurs, rubs, clicks or gallops. Skin exam - normal coloration and turgor, no rashes, no suspicious skin lesions noted. Musculoskeletal exam: no joint tenderness, deformity or swelling straight leg raise positive at 45 degrees on the right.  Cross straight leg raise positive on the right.  Left lower extremity negative.  Gait is normal.  No palpable nodule, mass, or hematoma palpated on exam..  Mental Status: normal mood, behavior, speech, dress, motor activity, and thought processes, anxious.   A/P:  1. Muscle cramp   2. Breast mass in female     Differential of her lower extremity and vein lower extremity pain includes a blood clot which is on the lower lower and given that there is no tightness, warmth, history of blood clot.  Pain is in an S1 distribution on the right but does not  come from her spine or go through her buttocks really starts mid posterior hamstring.  Likely muscle spasm with residual soreness.  Increase amount of water that she drinks to where her urine is clear.  She could try adding magnesium 400 mg once to twice a day with food for muscle spasms.  Today we will check a CMP and TSH as she will undergo surgery and will also review her electrolytes to ensure that they are not a contributing factor her to her muscle spasm.  Follow-up as needed.  Patient is in line with this plan.

## 2019-08-24 NOTE — Patient Instructions (Addendum)
Muscle Cramps and Spasms °Muscle cramps and spasms occur when a muscle or muscles tighten and you have no control over this tightening (involuntary muscle contraction). They are a common problem and can develop in any muscle. The most common place is in the calf muscles of the leg. Muscle cramps and muscle spasms are both involuntary muscle contractions, but there are some differences between the two: °· Muscle cramps are painful. They come and go and may last for a few seconds or up to 15 minutes. Muscle cramps are often more forceful and last longer than muscle spasms. °· Muscle spasms may or may not be painful. They may also last just a few seconds or much longer. °Certain medical conditions, such as diabetes or Parkinson's disease, can make it more likely to develop cramps or spasms. However, cramps or spasms are usually not caused by a serious underlying problem. Common causes include: °· Doing more physical work or exercise than your body is ready for (overexertion). °· Overuse from repeating certain movements too many times. °· Remaining in a certain position for a long period of time. °· Improper preparation, form, or technique while playing a sport or doing an activity. °· Dehydration. °· Injury. °· Side effects of some medicines. °· Abnormally low levels of the salts and minerals in your blood (electrolytes), especially potassium and calcium. This could happen if you are taking water pills (diuretics) or if you are pregnant. °In many cases, the cause of muscle cramps or spasms is not known. °Follow these instructions at home: °Managing pain and stiffness ° °  ° °· Try massaging, stretching, and relaxing the affected muscle. Do this for several minutes at a time. °· If directed, apply heat to tight or tense muscles as often as told by your health care provider. Use the heat source that your health care provider recommends, such as a moist heat pack or a heating pad. °? Place a towel between your skin and  the heat source. °? Leave the heat on for 20-30 minutes. °? Remove the heat if your skin turns bright red. This is especially important if you are unable to feel pain, heat, or cold. You may have a greater risk of getting burned. °· If directed, put ice on the affected area. This may help if you are sore or have pain after a cramp or spasm. °? Put ice in a plastic bag. °? Place a towel between your skin and the bag. °? Leave the ice on for 20 minutes, 2-3 times a day. °· Try taking hot showers or baths to help relax tight muscles. °Eating and drinking °· Drink enough fluid to keep your urine pale yellow. Staying well hydrated may help prevent cramps or spasms. °· Eat a healthy diet that includes plenty of nutrients to help your muscles function. A healthy diet includes fruits and vegetables, lean protein, whole grains, and low-fat or nonfat dairy products. °General instructions °· If you are having frequent cramps, avoid intense exercise for several days. °· Take over-the-counter and prescription medicines only as told by your health care provider. °· Pay attention to any changes in your symptoms. °· Keep all follow-up visits as told by your health care provider. This is important. °Contact a health care provider if: °· Your cramps or spasms get more severe or happen more often. °· Your cramps or spasms do not improve over time. °Summary °· Muscle cramps and spasms occur when a muscle or muscles tighten and you have no control over this   tightening (involuntary muscle contraction). °· The most common place for cramps or spasms to occur is in the calf muscles of the leg. °· Massaging, stretching, and relaxing the affected muscle may relieve the cramp or spasm. °· Drink enough fluid to keep your urine pale yellow. Staying well hydrated may help prevent cramps or spasms. °This information is not intended to replace advice given to you by your health care provider. Make sure you discuss any questions you have with your  health care provider. °Document Revised: 10/17/2017 Document Reviewed: 10/17/2017 °Elsevier Patient Education © 2020 [employer] ° °

## 2019-08-25 HISTORY — PX: BREAST BIOPSY: SHX20

## 2019-08-25 LAB — THYROID PANEL WITH TSH
Free Thyroxine Index: 2.5 (ref 1.2–4.9)
T3 Uptake Ratio: 28 % (ref 24–39)
T4, Total: 9 ug/dL (ref 4.5–12.0)
TSH: 1.02 u[IU]/mL (ref 0.450–4.500)

## 2019-08-25 LAB — CMP14+EGFR
ALT: 17 IU/L (ref 0–32)
AST: 22 IU/L (ref 0–40)
Albumin/Globulin Ratio: 1.8 (ref 1.2–2.2)
Albumin: 4.8 g/dL (ref 3.8–4.9)
Alkaline Phosphatase: 91 IU/L (ref 39–117)
BUN/Creatinine Ratio: 19 (ref 9–23)
BUN: 14 mg/dL (ref 6–24)
Bilirubin Total: 0.5 mg/dL (ref 0.0–1.2)
CO2: 24 mmol/L (ref 20–29)
Calcium: 9.6 mg/dL (ref 8.7–10.2)
Chloride: 105 mmol/L (ref 96–106)
Creatinine, Ser: 0.72 mg/dL (ref 0.57–1.00)
GFR calc Af Amer: 111 mL/min/{1.73_m2} (ref >59)
GFR calc non Af Amer: 96 mL/min/{1.73_m2} (ref >59)
Globulin, Total: 2.7 g/dL (ref 1.5–4.5)
Glucose: 110 mg/dL — ABNORMAL HIGH (ref 65–99)
Potassium: 3.9 mmol/L (ref 3.5–5.2)
Sodium: 142 mmol/L (ref 134–144)
Total Protein: 7.5 g/dL (ref 6.0–8.5)

## 2019-08-28 ENCOUNTER — Other Ambulatory Visit: Payer: Self-pay | Admitting: Surgery

## 2019-08-28 DIAGNOSIS — N6311 Unspecified lump in the right breast, upper outer quadrant: Secondary | ICD-10-CM

## 2019-09-06 ENCOUNTER — Ambulatory Visit: Payer: BLUE CROSS/BLUE SHIELD | Admitting: Adult Health Nurse Practitioner

## 2019-09-06 ENCOUNTER — Other Ambulatory Visit: Payer: Self-pay

## 2019-09-06 NOTE — Patient Instructions (Signed)
° ° ° °  If you have lab work done today you will be contacted with your lab results within the next 2 weeks.  If you have not heard from us then please contact us. The fastest way to get your results is to register for My Chart. ° ° °IF you received an x-ray today, you will receive an invoice from [facility]. Please contact [facility] at [phone] with questions or concerns regarding your invoice.  ° °IF you received labwork today, you will receive an invoice from [facility]. Please contact [facility] at [phone] with questions or concerns regarding your invoice.  ° °Our billing staff will not be able to assist you with questions regarding bills from these companies. ° °You will be contacted with the lab results as soon as they are available. The fastest way to get your results is to activate your My Chart account. Instructions are located on the last page of this paperwork. If you have not heard from us regarding the results in 2 weeks, please contact this office. °  ° ° ° °

## 2019-09-10 ENCOUNTER — Encounter (HOSPITAL_BASED_OUTPATIENT_CLINIC_OR_DEPARTMENT_OTHER): Payer: Self-pay | Admitting: Surgery

## 2019-09-10 ENCOUNTER — Other Ambulatory Visit: Payer: Self-pay

## 2019-09-14 ENCOUNTER — Other Ambulatory Visit (HOSPITAL_COMMUNITY)
Admission: RE | Admit: 2019-09-14 | Discharge: 2019-09-14 | Disposition: A | Discharge status: Home / Self Care | Payer: BLUE CROSS/BLUE SHIELD | Source: Ambulatory Visit | Attending: Surgery | Admitting: Surgery

## 2019-09-14 DIAGNOSIS — Z01812 Encounter for preprocedural laboratory examination: Secondary | ICD-10-CM | POA: Insufficient documentation

## 2019-09-14 DIAGNOSIS — Z20822 Contact with and (suspected) exposure to covid-19: Secondary | ICD-10-CM | POA: Diagnosis not present

## 2019-09-15 LAB — SARS CORONAVIRUS 2 (TAT 6-24 HRS): SARS Coronavirus 2: NEGATIVE

## 2019-09-16 NOTE — Anesthesia Preprocedure Evaluation (Addendum)
Anesthesia Evaluation  Patient identified by MRN, date of birth, ID band Patient awake    Reviewed: Allergy & Precautions, NPO status , Patient's Chart, lab work & pertinent test results  History of Anesthesia Complications Negative for: history of anesthetic complications  Airway Mallampati: II  TM Distance: >3 FB Neck ROM: Full    Dental no notable dental hx.    Pulmonary neg pulmonary ROS,    Pulmonary exam normal        Cardiovascular negative cardio ROS Normal cardiovascular exam     Neuro/Psych negative neurological ROS  negative psych ROS   GI/Hepatic negative GI ROS, Neg liver ROS,   Endo/Other  negative endocrine ROS  Renal/GU negative Renal ROS  negative genitourinary   Musculoskeletal negative musculoskeletal ROS (+)   Abdominal   Peds  Hematology negative hematology ROS (+)   Anesthesia Other Findings Right breast lesion  Reproductive/Obstetrics negative OB ROS                            Anesthesia Physical Anesthesia Plan  ASA: II  Anesthesia Plan: General   Post-op Pain Management:    Induction: Intravenous  PONV Risk Score and Plan: 3 and Treatment may vary due to age or medical condition, Ondansetron, Dexamethasone and Midazolam  Airway Management Planned: LMA  Additional Equipment: None  Intra-op Plan:   Post-operative Plan: Extubation in OR  Informed Consent: I have reviewed the patients History and Physical, chart, labs and discussed the procedure including the risks, benefits and alternatives for the proposed anesthesia with the patient or authorized representative who has indicated his/her understanding and acceptance.     Dental advisory given  Plan Discussed with: CRNA  Anesthesia Plan Comments:        Anesthesia Quick Evaluation

## 2019-09-17 ENCOUNTER — Ambulatory Visit
Admission: RE | Admit: 2019-09-17 | Discharge: 2019-09-17 | Disposition: A | Discharge status: Home / Self Care | Payer: BLUE CROSS/BLUE SHIELD | Source: Ambulatory Visit | Attending: Surgery | Admitting: Surgery

## 2019-09-17 ENCOUNTER — Other Ambulatory Visit: Payer: Self-pay

## 2019-09-17 DIAGNOSIS — N6311 Unspecified lump in the right breast, upper outer quadrant: Secondary | ICD-10-CM

## 2019-09-17 DIAGNOSIS — R928 Other abnormal and inconclusive findings on diagnostic imaging of breast: Secondary | ICD-10-CM | POA: Diagnosis not present

## 2019-09-17 NOTE — Progress Notes (Signed)

## 2019-09-18 ENCOUNTER — Ambulatory Visit (HOSPITAL_BASED_OUTPATIENT_CLINIC_OR_DEPARTMENT_OTHER)
Admission: RE | Admit: 2019-09-18 | Discharge: 2019-09-18 | Disposition: A | Discharge status: Home / Self Care | Payer: BLUE CROSS/BLUE SHIELD | Attending: Surgery | Admitting: Surgery

## 2019-09-18 ENCOUNTER — Ambulatory Visit
Admission: RE | Admit: 2019-09-18 | Discharge: 2019-09-18 | Disposition: A | Discharge status: Home / Self Care | Payer: BLUE CROSS/BLUE SHIELD | Source: Ambulatory Visit | Attending: Surgery | Admitting: Surgery

## 2019-09-18 ENCOUNTER — Other Ambulatory Visit: Payer: Self-pay

## 2019-09-18 ENCOUNTER — Ambulatory Visit (HOSPITAL_BASED_OUTPATIENT_CLINIC_OR_DEPARTMENT_OTHER): Payer: BLUE CROSS/BLUE SHIELD | Admitting: Anesthesiology

## 2019-09-18 ENCOUNTER — Encounter (HOSPITAL_BASED_OUTPATIENT_CLINIC_OR_DEPARTMENT_OTHER): Payer: Self-pay | Admitting: Surgery

## 2019-09-18 ENCOUNTER — Encounter (HOSPITAL_BASED_OUTPATIENT_CLINIC_OR_DEPARTMENT_OTHER)
Admission: RE | Disposition: A | Discharge status: Home / Self Care | Payer: Self-pay | Source: Home / Self Care | Attending: Surgery

## 2019-09-18 DIAGNOSIS — N6311 Unspecified lump in the right breast, upper outer quadrant: Secondary | ICD-10-CM

## 2019-09-18 DIAGNOSIS — N6310 Unspecified lump in the right breast, unspecified quadrant: Secondary | ICD-10-CM | POA: Diagnosis not present

## 2019-09-18 DIAGNOSIS — N6091 Unspecified benign mammary dysplasia of right breast: Secondary | ICD-10-CM | POA: Diagnosis not present

## 2019-09-18 DIAGNOSIS — Z8249 Family history of ischemic heart disease and other diseases of the circulatory system: Secondary | ICD-10-CM | POA: Insufficient documentation

## 2019-09-18 DIAGNOSIS — N6021 Fibroadenosis of right breast: Secondary | ICD-10-CM | POA: Insufficient documentation

## 2019-09-18 DIAGNOSIS — N6011 Diffuse cystic mastopathy of right breast: Secondary | ICD-10-CM | POA: Diagnosis not present

## 2019-09-18 DIAGNOSIS — D509 Iron deficiency anemia, unspecified: Secondary | ICD-10-CM | POA: Diagnosis not present

## 2019-09-18 DIAGNOSIS — Z803 Family history of malignant neoplasm of breast: Secondary | ICD-10-CM | POA: Insufficient documentation

## 2019-09-18 DIAGNOSIS — M24819 Other specific joint derangements of unspecified shoulder, not elsewhere classified: Secondary | ICD-10-CM | POA: Diagnosis not present

## 2019-09-18 DIAGNOSIS — N6081 Other benign mammary dysplasias of right breast: Secondary | ICD-10-CM | POA: Diagnosis not present

## 2019-09-18 DIAGNOSIS — N6489 Other specified disorders of breast: Secondary | ICD-10-CM | POA: Diagnosis not present

## 2019-09-18 DIAGNOSIS — R928 Other abnormal and inconclusive findings on diagnostic imaging of breast: Secondary | ICD-10-CM | POA: Diagnosis not present

## 2019-09-18 HISTORY — PX: BREAST EXCISIONAL BIOPSY: SUR124

## 2019-09-18 HISTORY — PX: BREAST LUMPECTOMY WITH RADIOACTIVE SEED LOCALIZATION: SHX6424

## 2019-09-18 SURGERY — BREAST LUMPECTOMY WITH RADIOACTIVE SEED LOCALIZATION
Anesthesia: General | Site: Breast | Laterality: Right

## 2019-09-18 MED ORDER — MIDAZOLAM HCL 5 MG/5ML IJ SOLN
INTRAMUSCULAR | Status: DC | PRN
  Administered 2019-09-18: 2 mg via INTRAVENOUS

## 2019-09-18 MED ORDER — PROMETHAZINE HCL 25 MG/ML IJ SOLN: 6.2500 mg | INTRAMUSCULAR | Status: DC | PRN

## 2019-09-18 MED ORDER — OXYCODONE HCL 5 MG/5ML PO SOLN: 5.0000 mg | Freq: Once | ORAL | Status: DC | PRN

## 2019-09-18 MED ORDER — ACETAMINOPHEN 500 MG PO TABS
ORAL_TABLET | ORAL | Status: AC
  Filled 2019-09-18: qty 2

## 2019-09-18 MED ORDER — DEXAMETHASONE SODIUM PHOSPHATE 4 MG/ML IJ SOLN
INTRAMUSCULAR | Status: DC | PRN
  Administered 2019-09-18: 5 mg via INTRAVENOUS

## 2019-09-18 MED ORDER — FENTANYL CITRATE (PF) 100 MCG/2ML IJ SOLN: 50.0000 ug | INTRAMUSCULAR | Status: DC | PRN

## 2019-09-18 MED ORDER — MIDAZOLAM HCL 2 MG/2ML IJ SOLN
INTRAMUSCULAR | Status: AC
  Filled 2019-09-18: qty 2

## 2019-09-18 MED ORDER — CEFAZOLIN SODIUM-DEXTROSE 2-4 GM/100ML-% IV SOLN
INTRAVENOUS | Status: AC
  Filled 2019-09-18: qty 100

## 2019-09-18 MED ORDER — ONDANSETRON HCL 4 MG/2ML IJ SOLN
INTRAMUSCULAR | Status: AC
  Filled 2019-09-18: qty 2

## 2019-09-18 MED ORDER — FENTANYL CITRATE (PF) 100 MCG/2ML IJ SOLN
INTRAMUSCULAR | Status: AC
  Filled 2019-09-18: qty 2

## 2019-09-18 MED ORDER — LIDOCAINE HCL (CARDIAC) PF 100 MG/5ML IV SOSY
PREFILLED_SYRINGE | INTRAVENOUS | Status: DC | PRN
  Administered 2019-09-18: 80 mg via INTRAVENOUS

## 2019-09-18 MED ORDER — ONDANSETRON HCL 4 MG/2ML IJ SOLN
INTRAMUSCULAR | Status: DC | PRN
  Administered 2019-09-18: 4 mg via INTRAVENOUS

## 2019-09-18 MED ORDER — CHLORHEXIDINE GLUCONATE CLOTH 2 % EX PADS: 6.0000 | MEDICATED_PAD | Freq: Once | CUTANEOUS | Status: DC

## 2019-09-18 MED ORDER — LACTATED RINGERS IV SOLN: INTRAVENOUS | Status: DC

## 2019-09-18 MED ORDER — OXYCODONE HCL 5 MG PO TABS: 5.0000 mg | ORAL_TABLET | Freq: Once | ORAL | Status: DC | PRN

## 2019-09-18 MED ORDER — FENTANYL CITRATE (PF) 100 MCG/2ML IJ SOLN: 25.0000 ug | INTRAMUSCULAR | Status: DC | PRN

## 2019-09-18 MED ORDER — LIDOCAINE 2% (20 MG/ML) 5 ML SYRINGE
INTRAMUSCULAR | Status: AC
  Filled 2019-09-18: qty 5

## 2019-09-18 MED ORDER — EPHEDRINE SULFATE 50 MG/ML IJ SOLN
INTRAMUSCULAR | Status: DC | PRN
  Administered 2019-09-18 (×2): 10 mg via INTRAVENOUS

## 2019-09-18 MED ORDER — GABAPENTIN 300 MG PO CAPS
ORAL_CAPSULE | ORAL | Status: AC
  Filled 2019-09-18: qty 1

## 2019-09-18 MED ORDER — PROPOFOL 10 MG/ML IV BOLUS
INTRAVENOUS | Status: DC | PRN
  Administered 2019-09-18: 150 mg via INTRAVENOUS

## 2019-09-18 MED ORDER — HYDROCODONE-ACETAMINOPHEN 5-325 MG PO TABS: 1.0000 | ORAL_TABLET | Freq: Four times a day (QID) | ORAL | 0 refills | Status: DC | PRN

## 2019-09-18 MED ORDER — ACETAMINOPHEN 500 MG PO TABS
1000.0000 mg | ORAL_TABLET | ORAL | Status: AC
  Administered 2019-09-18: 1000 mg via ORAL

## 2019-09-18 MED ORDER — CEFAZOLIN SODIUM-DEXTROSE 2-4 GM/100ML-% IV SOLN
2.0000 g | INTRAVENOUS | Status: AC
  Administered 2019-09-18: 2 g via INTRAVENOUS

## 2019-09-18 MED ORDER — PROPOFOL 10 MG/ML IV BOLUS
INTRAVENOUS | Status: AC
  Filled 2019-09-18: qty 20

## 2019-09-18 MED ORDER — GABAPENTIN 300 MG PO CAPS
300.0000 mg | ORAL_CAPSULE | ORAL | Status: AC
  Administered 2019-09-18: 300 mg via ORAL

## 2019-09-18 MED ORDER — MIDAZOLAM HCL 2 MG/2ML IJ SOLN: 1.0000 mg | INTRAMUSCULAR | Status: DC | PRN

## 2019-09-18 MED ORDER — BUPIVACAINE HCL (PF) 0.25 % IJ SOLN
INTRAMUSCULAR | Status: DC | PRN
  Administered 2019-09-18: 10 mL

## 2019-09-18 MED ORDER — FENTANYL CITRATE (PF) 100 MCG/2ML IJ SOLN
INTRAMUSCULAR | Status: DC | PRN
  Administered 2019-09-18 (×2): 25 ug via INTRAVENOUS
  Administered 2019-09-18: 50 ug via INTRAVENOUS
  Administered 2019-09-18: 25 ug via INTRAVENOUS

## 2019-09-18 SURGICAL SUPPLY — 49 items
APL PRP STRL LF DISP 70% ISPRP (MISCELLANEOUS) ×1
APL SKNCLS STERI-STRIP NONHPOA (GAUZE/BANDAGES/DRESSINGS) ×1
APPLIER CLIP 9.375 MED OPEN (MISCELLANEOUS)
APR CLP MED 9.3 20 MLT OPN (MISCELLANEOUS)
BENZOIN TINCTURE PRP APPL 2/3 (GAUZE/BANDAGES/DRESSINGS) ×2 IMPLANT
BLADE HEX COATED 2.75 (ELECTRODE) ×2 IMPLANT
BLADE SURG 15 STRL LF DISP TIS (BLADE) ×1 IMPLANT
BLADE SURG 15 STRL SS (BLADE) ×2
CANISTER SUC SOCK COL 7IN (MISCELLANEOUS) IMPLANT
CANISTER SUCT 1200ML W/VALVE (MISCELLANEOUS) ×1 IMPLANT
CHLORAPREP W/TINT 26 (MISCELLANEOUS) ×2 IMPLANT
CLIP APPLIE 9.375 MED OPEN (MISCELLANEOUS) ×1 IMPLANT
COVER BACK TABLE 60X90IN (DRAPES) ×2 IMPLANT
COVER MAYO STAND STRL (DRAPES) ×2 IMPLANT
COVER PROBE W GEL 5X96 (DRAPES) ×2 IMPLANT
DRAPE LAPAROTOMY 100X72 PEDS (DRAPES) ×2 IMPLANT
DRAPE UTILITY XL STRL (DRAPES) ×2 IMPLANT
DRSG TEGADERM 4X4.75 (GAUZE/BANDAGES/DRESSINGS) ×2 IMPLANT
ELECT REM PT RETURN 9FT ADLT (ELECTROSURGICAL) ×2
ELECTRODE REM PT RTRN 9FT ADLT (ELECTROSURGICAL) ×1 IMPLANT
GAUZE SPONGE 4X4 12PLY STRL LF (GAUZE/BANDAGES/DRESSINGS) ×1 IMPLANT
GLOVE BIO SURGEON STRL SZ 6.5 (GLOVE) ×1 IMPLANT
GLOVE BIO SURGEON STRL SZ7 (GLOVE) ×2 IMPLANT
GLOVE BIOGEL PI IND STRL 7.0 (GLOVE) IMPLANT
GLOVE BIOGEL PI IND STRL 7.5 (GLOVE) ×1 IMPLANT
GLOVE BIOGEL PI INDICATOR 7.0 (GLOVE) ×1
GLOVE BIOGEL PI INDICATOR 7.5 (GLOVE) ×1
GLOVE SURG SS PI 7.0 STRL IVOR (GLOVE) ×1 IMPLANT
GOWN STRL REUS W/ TWL LRG LVL3 (GOWN DISPOSABLE) ×2 IMPLANT
GOWN STRL REUS W/TWL LRG LVL3 (GOWN DISPOSABLE) ×4
KIT MARKER MARGIN INK (KITS) ×2 IMPLANT
NDL HYPO 25X1 1.5 SAFETY (NEEDLE) ×1 IMPLANT
NEEDLE HYPO 25X1 1.5 SAFETY (NEEDLE) ×2 IMPLANT
NS IRRIG 1000ML POUR BTL (IV SOLUTION) ×2 IMPLANT
PACK BASIN DAY SURGERY FS (CUSTOM PROCEDURE TRAY) ×2 IMPLANT
PENCIL SMOKE EVACUATOR (MISCELLANEOUS) ×2 IMPLANT
SLEEVE SCD COMPRESS KNEE MED (MISCELLANEOUS) ×2 IMPLANT
SPONGE LAP 4X18 RFD (DISPOSABLE) ×2 IMPLANT
STRIP CLOSURE SKIN 1/2X4 (GAUZE/BANDAGES/DRESSINGS) ×2 IMPLANT
SUT MON AB 4-0 PC3 18 (SUTURE) ×2 IMPLANT
SUT SILK 2 0 SH (SUTURE) IMPLANT
SUT VIC AB 3-0 SH 27 (SUTURE) ×2
SUT VIC AB 3-0 SH 27X BRD (SUTURE) ×1 IMPLANT
SYR BULB 3OZ (MISCELLANEOUS) ×1 IMPLANT
SYR CONTROL 10ML LL (SYRINGE) ×2 IMPLANT
TOWEL GREEN STERILE FF (TOWEL DISPOSABLE) ×2 IMPLANT
TRAY FAXITRON CT DISP (TRAY / TRAY PROCEDURE) ×2 IMPLANT
TUBE CONNECTING 20X1/4 (TUBING) IMPLANT
YANKAUER SUCT BULB TIP NO VENT (SUCTIONS) IMPLANT

## 2019-09-18 NOTE — Op Note (Signed)
Pre-op Diagnosis:  Complex sclerosing lesion right breast/ family history of breast cancer Post-op Diagnosis: same Procedure:  Right radioactive seed localized lumpectomy Surgeon:  Brooklyne Radke K. Anesthesia:  GEN - LMA Indications:  This is a healthy 54 year old female with a family history of breast cancer in her mother who presents with a right breast mass. This was found on a recent routine screening mammogram. In 2006, she had a biopsy performed in the right breast that showed fibrocystic changes, apocrine metaplasia, focal ductal hyperplasia without atypia and microcalcifications. The most recent area of distortion is in the same region. This was biopsied and revealed a complex sclerosing lesion. Description of procedure: The patient is brought to the operating room placed in supine position on the operating room table. After an adequate level of general anesthesia was obtained, her right breast was prepped with ChloraPrep and draped in sterile fashion. A timeout was taken to ensure the proper patient and proper procedure. We interrogated the breast with the neoprobe. We made a circumareolar incision around the lateral side of the nipple after infiltrating with 0.25% Marcaine. Dissection was carried down in the breast tissue with cautery. We used the neoprobe to guide [place] towards the radioactive seed. We excised an area of tissue around the radioactive seed 2 cm in diameter. The specimen was removed and was oriented with a paint kit. Specimen mammogram showed the radioactive seed as well as the biopsy clip within the specimen. This was sent for pathologic examination. There is no residual radioactivity within the biopsy cavity. We inspected carefully for hemostasis. There is some mild thickening inferiorly, so I took another inferior margin, which was also oriented with paint.  The wound was thoroughly irrigated. The wound was closed with a deep layer of 3-0 Vicryl and a subcuticular layer of 4-0  Monocryl. Benzoin Steri-Strips were applied. The patient was then extubated and brought to the recovery room in stable condition. All sponge, instrument, and needle counts are correct.  Imogene Burn. Georgette Dover, MD, [facility] Surgery  General/ Trauma Surgery  09/18/2019 12:01 PM

## 2019-09-18 NOTE — Anesthesia Postprocedure Evaluation (Signed)
Anesthesia Post Note  Patient: Ariana [name]  Procedure(s) Performed: RIGHT BREAST LUMPECTOMY WITH RADIOACTIVE SEED LOCALIZATION (Right Breast)     Patient location during evaluation: PACU Anesthesia Type: General Level of consciousness: awake and alert and oriented Pain management: pain level controlled Vital Signs Assessment: post-procedure vital signs reviewed and stable Respiratory status: spontaneous breathing, nonlabored ventilation and respiratory function stable Cardiovascular status: blood pressure returned to baseline Postop Assessment: no apparent nausea or vomiting Anesthetic complications: no    Last Vitals:  Vitals:   09/18/19 1230 09/18/19 1245  BP: 132/84 138/80  Pulse: 84 84  Resp: 15 14  Temp:    SpO2: 100% 100%    Last Pain:  Vitals:   09/18/19 1245  TempSrc:   PainSc: 2                  Kaylyn Layer

## 2019-09-18 NOTE — Discharge Instructions (Signed)
[facility] Office Phone Number [phone]  BREAST BIOPSY/ PARTIAL MASTECTOMY: POST OP INSTRUCTIONS  Always review your discharge instruction sheet given to you by the facility where your surgery was performed.  IF YOU HAVE DISABILITY OR FAMILY LEAVE FORMS, YOU MUST BRING THEM TO THE OFFICE FOR PROCESSING.  DO NOT GIVE THEM TO YOUR DOCTOR.  1. A prescription for pain medication may be given to you upon discharge.  Take your pain medication as prescribed, if needed.  If narcotic pain medicine is not needed, then you may take acetaminophen (Tylenol) or ibuprofen (Advil) as needed. 2. Take your usually prescribed medications unless otherwise directed 3. If you need a refill on your pain medication, please contact your pharmacy.  They will contact our office to request authorization.  Prescriptions will not be filled after 5pm or on week-ends. 4. You should eat very light the first 24 hours after surgery, such as soup, crackers, pudding, etc.  Resume your normal diet the day after surgery. 5. Most patients will experience some swelling and bruising in the breast.  Ice packs and a good support bra will help.  Swelling and bruising can take several days to resolve.  6. It is common to experience some constipation if taking pain medication after surgery.  Increasing fluid intake and taking a stool softener will usually help or prevent this problem from occurring.  A mild laxative (Milk of Magnesia or Miralax) should be taken according to package directions if there are no bowel movements after 48 hours. 7. Unless discharge instructions indicate otherwise, you may remove your bandages 48 hours after surgery, and you may shower at that time.  You will have steri-strips (small skin tapes) in place directly over the incision.  These strips should be left on the skin for 7-10 days.   Any sutures or staples will be removed at the office during your follow-up visit. 8. ACTIVITIES:  You may resume  regular daily activities (gradually increasing) beginning the next day.  Wearing a good support bra or sports bra minimizes pain and swelling.  You may have sexual intercourse when it is comfortable. a. You may drive when you no longer are taking prescription pain medication, you can comfortably wear a seatbelt, and you can safely maneuver your car and apply brakes. b. RETURN TO WORK:  1-2 weeks 9. You should see your doctor in the office for a follow-up appointment approximately two weeks after your surgery.  Your doctor's nurse will typically make your follow-up appointment when she calls you with your pathology report.  Expect your pathology report 2-3 business days after your surgery.  You may call to check if you do not hear from [place] after three days. 10. OTHER INSTRUCTIONS: _______________________________________________________________________________________________ _____________________________________________________________________________________________________________________________________ _____________________________________________________________________________________________________________________________________ _____________________________________________________________________________________________________________________________________  WHEN TO CALL YOUR DOCTOR: 1. Fever over 101.0 2. Nausea and/or vomiting. 3. Extreme swelling or bruising. 4. Continued bleeding from incision. 5. Increased pain, redness, or drainage from the incision.  The clinic staff is available to answer your questions during regular business hours.  Please don't hesitate to call and ask to speak to one of the nurses for clinical concerns.  If you have a medical emergency, go to the nearest emergency room or call 911.  A surgeon from [facility] is always on call at the hospital.  For further questions, please visit [email or web address]      Post Anesthesia Home Care  Instructions  Activity: Get plenty of rest for the remainder of the day. A responsible individual must  stay with you for 24 hours following the procedure.  For the next 24 hours, DO NOT: -Drive a car -Paediatric nurse -Drink alcoholic beverages -Take any medication unless instructed by your physician -Make any legal decisions or sign important papers.  Meals: Start with liquid foods such as gelatin or soup. Progress to regular foods as tolerated. Avoid greasy, spicy, heavy foods. If nausea and/or vomiting occur, drink only clear liquids until the nausea and/or vomiting subsides. Call your physician if vomiting continues.  Special Instructions/Symptoms: Your throat may feel dry or sore from the anesthesia or the breathing tube placed in your throat during surgery. If this causes discomfort, gargle with warm salt water. The discomfort should disappear within 24 hours.  If you had a scopolamine patch placed behind your ear for the management of post- operative nausea and/or vomiting:  1. The medication in the patch is effective for 72 hours, after which it should be removed.  Wrap patch in a tissue and discard in the trash. Wash hands thoroughly with soap and water. 2. You may remove the patch earlier than 72 hours if you experience unpleasant side effects which may include dry mouth, dizziness or visual disturbances. 3. Avoid touching the patch. Wash your hands with soap and water after contact with the patch.     YOU HAD TYLENOL 1000 MG AT 9:00 AM. YOU MAY TAKE TYLENOL AGAIN ANYTIME AFTER 3:00 PM

## 2019-09-18 NOTE — Interval H&P Note (Signed)
History and Physical Interval Note:  09/18/2019 9:44 AM  Ariana [name]  has presented today for surgery, with the diagnosis of COMPLEX SCLEROSING LESION RIGHT UPPER OUTER QUADRANT.  The various methods of treatment have been discussed with the patient and family. After consideration of risks, benefits and other options for treatment, the patient has consented to  Procedure(s): RIGHT BREAST LUMPECTOMY WITH RADIOACTIVE SEED LOCALIZATION (Right) as a surgical intervention.  The patient's history has been reviewed, patient examined, no change in status, stable for surgery.  I have reviewed the patient's chart and labs.  Questions were answered to the patient's satisfaction.     Wynona Luna

## 2019-09-18 NOTE — Anesthesia Procedure Notes (Signed)
Procedure Name: LMA Insertion Date/Time: 09/18/2019 11:02 AM Performed by: Cleda Clarks, CRNA Pre-anesthesia Checklist: Patient identified, Emergency Drugs available, Suction available and Patient being monitored Patient Re-evaluated:Patient Re-evaluated prior to induction Oxygen Delivery Method: Circle system utilized Preoxygenation: Pre-oxygenation with 100% oxygen Induction Type: IV induction Ventilation: Mask ventilation without difficulty LMA: LMA inserted LMA Size: 4.0 Number of attempts: 1 Airway Equipment and Method: Bite block Placement Confirmation: positive ETCO2 Tube secured with: Tape Dental Injury: Teeth and Oropharynx as per pre-operative assessment

## 2019-09-18 NOTE — Transfer of Care (Signed)
Immediate Anesthesia Transfer of Care Note  Patient: Ariana [name]  Procedure(s) Performed: RIGHT BREAST LUMPECTOMY WITH RADIOACTIVE SEED LOCALIZATION (Right Breast)  Patient Location: PACU  Anesthesia Type:General  Level of Consciousness: awake, alert  and oriented  Airway & Oxygen Therapy: Patient Spontanous Breathing and Patient connected to nasal cannula oxygen  Post-op Assessment: Report given to RN and Post -op Vital signs reviewed and stable  Post vital signs: Reviewed and stable  Last Vitals:  Vitals Value Taken Time  BP 126/79 09/18/19 1206  Temp    Pulse 86 09/18/19 1208  Resp 17 09/18/19 1208  SpO2 100 % 09/18/19 1208  Vitals shown include unvalidated device data.  Last Pain:  Vitals:   09/18/19 0855  TempSrc: Oral  PainSc: 0-No pain      Patients Stated Pain Goal: 3 (09/18/19 0855)  Complications: No apparent anesthesia complications

## 2019-09-20 LAB — SURGICAL PATHOLOGY

## 2019-09-21 ENCOUNTER — Encounter: Payer: Self-pay | Admitting: *Deleted

## 2019-10-17 NOTE — Progress Notes (Signed)
Left message for patient regarding blood work.  Elyse Jarvis, NP

## 2020-03-24 DIAGNOSIS — Z23 Encounter for immunization: Secondary | ICD-10-CM | POA: Diagnosis not present

## 2020-03-25 ENCOUNTER — Other Ambulatory Visit: Payer: Self-pay | Admitting: Family Medicine

## 2020-03-25 NOTE — Telephone Encounter (Signed)
  Notes to clinic: Review for refill Last filled by a historical provider    Requested Prescriptions  Pending Prescriptions Disp Refills   conjugated estrogens (PREMARIN) vaginal cream 42.5 g     Sig: Place 1 Applicatorful vaginally daily. For initial 1-2 weeks, then taper to lowest effective dose (1-3 times per week).      OB/GYN:  Estrogens Passed - 03/25/2020  2:51 PM      Passed - Mammogram is up-to-date per Health Maintenance      Passed - Last BP in normal range    BP Readings from Last 1 Encounters:  09/18/19 138/86          Passed - Valid encounter within last 12 months    Recent Outpatient Visits           6 months ago    Primary Care at [facility], Lonna Cobb, NP   7 months ago Muscle cramp   Primary Care at [facility], Lonna Cobb, NP   10 months ago Arthralgia of upper arm, unspecified laterality   Primary Care at [facility], Lonna Cobb, NP   1 year ago Acute pain of left shoulder   Primary Care at [facility], Asencion Partridge, MD   2 years ago Upper airway cough syndrome   Primary Care at [facility], Madelaine Bhat, [place]

## 2020-03-25 NOTE — Telephone Encounter (Signed)
Medication Refill - Medication: Permarin   Has the patient contacted their pharmacy? Yes.   (Agent: If no, request that the patient contact the pharmacy for the refill.) (Agent: If yes, when and what did the pharmacy advise?)  Preferred Pharmacy (with phone number or street name):  [id number] [place], [place] - [address] AT [facility] [address]  [address] [place] [address]  Phone: [phone] Fax: [phone]  Hours: Not open 24 hours     Agent: Please be advised that RX refills may take up to 3 business days. We ask that you follow-up with your pharmacy.

## 2020-03-26 MED ORDER — ESTROGENS, CONJUGATED 0.625 MG/GM VA CREA: 1.0000 | TOPICAL_CREAM | Freq: Every day | VAGINAL | 0 refills | Status: DC

## 2020-03-26 NOTE — Telephone Encounter (Signed)
Please schedule to be seen 30 day has been sent

## 2020-03-26 NOTE — Telephone Encounter (Signed)
Please schedule patient to be seen with new pcp. No rx has been sent. No refill we be given. Need to be seen for this problem

## 2020-03-26 NOTE — Telephone Encounter (Signed)
NO further refills without a new pcp appt

## 2020-03-27 NOTE — Telephone Encounter (Signed)
Called pt sch a app on 06/16/20 @ 9;30 AM

## 2020-04-19 DIAGNOSIS — H524 Presbyopia: Secondary | ICD-10-CM | POA: Diagnosis not present

## 2020-06-16 ENCOUNTER — Other Ambulatory Visit: Payer: Self-pay

## 2020-06-16 ENCOUNTER — Ambulatory Visit: Payer: BLUE CROSS/BLUE SHIELD | Admitting: Family Medicine

## 2020-06-16 ENCOUNTER — Encounter: Payer: Self-pay | Admitting: Family Medicine

## 2020-06-16 VITALS — BP 148/90 | HR 74 | Temp 98.1°F | Ht 64.0 in | Wt 166.0 lb

## 2020-06-16 DIAGNOSIS — Z23 Encounter for immunization: Secondary | ICD-10-CM

## 2020-06-16 DIAGNOSIS — Z1329 Encounter for screening for other suspected endocrine disorder: Secondary | ICD-10-CM

## 2020-06-16 DIAGNOSIS — I10 Essential (primary) hypertension: Secondary | ICD-10-CM | POA: Diagnosis not present

## 2020-06-16 DIAGNOSIS — Z13220 Encounter for screening for lipoid disorders: Secondary | ICD-10-CM

## 2020-06-16 DIAGNOSIS — J45909 Unspecified asthma, uncomplicated: Secondary | ICD-10-CM

## 2020-06-16 DIAGNOSIS — Z1159 Encounter for screening for other viral diseases: Secondary | ICD-10-CM

## 2020-06-16 DIAGNOSIS — Z131 Encounter for screening for diabetes mellitus: Secondary | ICD-10-CM

## 2020-06-16 DIAGNOSIS — D509 Iron deficiency anemia, unspecified: Secondary | ICD-10-CM | POA: Diagnosis not present

## 2020-06-16 DIAGNOSIS — Z1321 Encounter for screening for nutritional disorder: Secondary | ICD-10-CM

## 2020-06-16 MED ORDER — ALBUTEROL SULFATE HFA 108 (90 BASE) MCG/ACT IN AERS: 2.0000 | INHALATION_SPRAY | Freq: Four times a day (QID) | RESPIRATORY_TRACT | 2 refills | Status: AC | PRN

## 2020-06-16 MED ORDER — CETIRIZINE HCL 10 MG PO TABS: 10.0000 mg | ORAL_TABLET | Freq: Every day | ORAL | 11 refills | Status: AC

## 2020-06-16 MED ORDER — HYDROCHLOROTHIAZIDE 25 MG PO TABS: 25.0000 mg | ORAL_TABLET | Freq: Every day | ORAL | 3 refills | Status: AC

## 2020-06-16 NOTE — Progress Notes (Addendum)
1/10/202210:45 AM  [place] 30-Aug-1965, 55 y.o., female [id number]  Chief Complaint  Patient presents with  . Transitions Of Care  . Cough    X 3 WEEKS - non productive - work in extreme cold temps at times    HPI:   Patient is a 55 y.o. female with past medical history significant for anemia who presents today for toc.  Been having a non-productive dry cough Has tried nyquil Using cough drops Denies heartburn symptoms Has tried flonase   Screening Pap: will schedule Tetanus: will get today Flu: October Shingles: next visit Eye doctor yearly Dentist: q 6 months Doesn't exercise regularly LMP: Not in 3 years Still taking premarin cream Issues with dry skin Has been using Vitamin E oil  Having increased right hip pain    Health Maintenance  Topic Date Due  . PAP SMEAR-Modifier  05/18/2018  . MAMMOGRAM  07/22/2021  . COLONOSCOPY (Pts 45-44yr Insurance coverage will need to be confirmed)  10/02/2023  . TETANUS/TDAP  06/16/2030  . INFLUENZA VACCINE  Completed  . COVID-19 Vaccine  Completed  . Hepatitis C Screening  Completed  . HIV Screening  Completed   HTN Goal< 130/80 BP Readings from Last 3 Encounters:  06/16/20 (!) 148/90  09/18/19 138/86  09/06/19 132/88      Depression screen PHQ 2/9 06/16/2020 09/06/2019 08/24/2019  Decreased Interest 0 0 -  Down, Depressed, Hopeless 0 0 0  PHQ - 2 Score 0 0 0    Fall Risk  06/16/2020 09/06/2019 08/24/2019 05/16/2019 09/13/2018  Falls in the past year? 0 0 0 0 0  Number falls in past yr: 0 0 0 0 -  Injury with Fall? 0 0 0 0 0  Follow up Falls evaluation completed Falls evaluation completed Falls evaluation completed Falls evaluation completed -     No Known Allergies  Prior to Admission medications   Medication Sig Start Date End Date Taking? Authorizing Provider  conjugated estrogens (PREMARIN) vaginal cream Place 1 Applicatorful vaginally daily. For initial 1-2 weeks, then taper to lowest  effective dose (1-3 times per week). 03/26/20  Yes GWendie Agreste MD  Niacin (VITAMIN B-3 PO) Take by mouth.    [provider]    Past Medical History:  Diagnosis Date  . AC joint derangement 05/22/2019  . Anemia     Past Surgical History:  Procedure Laterality Date  . BREAST LUMPECTOMY WITH RADIOACTIVE SEED LOCALIZATION Right 09/18/2019   Procedure: RIGHT BREAST LUMPECTOMY WITH RADIOACTIVE SEED LOCALIZATION;  Surgeon: TDonnie Mesa MD;  [facility]  Service: General;  Laterality: Right;  . TUBAL LIGATION      Social History   Tobacco Use  . Smoking status: Never Smoker  . Smokeless tobacco: Never Used  Substance Use Topics  . Alcohol use: Yes    Comment: socially    Family History  Problem Relation Age of Onset  . Cancer Mother   . Hypertension Mother   . Breast cancer Mother 642 . Hypertension Father   . Colon cancer Neg Hx   . Esophageal cancer Neg Hx   . Rectal cancer Neg Hx   . Stomach cancer Neg Hx     Review of Systems  Constitutional: Negative for chills, fever and malaise/fatigue.  Eyes: Negative for blurred vision and double vision.  Respiratory: Positive for cough. Negative for shortness of breath and wheezing.   Cardiovascular: Negative for chest pain, palpitations and leg swelling.  Gastrointestinal: Negative for  abdominal pain, blood in stool, constipation, diarrhea, heartburn, nausea and vomiting.  Genitourinary: Negative for dysuria, frequency, hematuria and urgency.  Musculoskeletal: Positive for joint pain. Negative for back pain.  Skin: Negative for rash.       Dry skin  Neurological: Negative for dizziness, weakness and headaches.     OBJECTIVE:  Today's Vitals   06/16/20 1000 06/16/20 1005  BP: (!) 163/98 (!) 148/90  Pulse: 74   Temp: 98.1 F (36.7 C)   SpO2: 97%   Weight: 166 lb (75.3 kg)   Height: _0  (1.626 m)    Body mass index is 28.49 kg/m.   Physical Exam Constitutional:       General: She is not in acute distress.    Appearance: Normal appearance. She is not ill-appearing.  HENT:     Head: Normocephalic.     Nose: No congestion or rhinorrhea.     Mouth/Throat:     Mouth: Mucous membranes are moist.     Pharynx: Oropharynx is clear. No oropharyngeal exudate or posterior oropharyngeal erythema.  Cardiovascular:     Rate and Rhythm: Normal rate and regular rhythm.     Pulses: Normal pulses.     Heart sounds: Normal heart sounds. No murmur heard. No friction rub. No gallop.   Pulmonary:     Effort: Pulmonary effort is normal. No respiratory distress.     Breath sounds: Normal breath sounds. No stridor. No wheezing, rhonchi or rales.  Abdominal:     General: Bowel sounds are normal.     Palpations: Abdomen is soft.     Tenderness: There is no abdominal tenderness.  Musculoskeletal:     Right knee: Normal.     Left knee: Normal.     Right lower leg: No edema.     Left lower leg: No edema.  Skin:    General: Skin is warm and dry.  Neurological:     Mental Status: She is alert and oriented to person, place, and time.  Psychiatric:        Mood and Affect: Mood normal.        Behavior: Behavior normal.     No results found for this or any previous visit (from the past 24 hour(s)).  No results found.   ASSESSMENT and PLAN  Problem List Items Addressed This Visit      Other   Iron deficiency anemia, unspecified - Primary   Relevant Orders   CBC   Iron, TIBC and Ferritin Panel    Other Visit Diagnoses    Screening for diabetes mellitus       Relevant Orders   Hemoglobin A1c   CMP14+EGFR   Screening, lipid       Relevant Orders   Lipid Panel   Screening for thyroid disorder       Relevant Orders   TSH   Encounter for vitamin deficiency screening       Relevant Orders   Vitamin D, 25-hydroxy   Encounter for hepatitis C screening test for low risk patient       Relevant Orders   Hepatitis C antibody   Asthma due to seasonal allergies        Relevant Medications   cetirizine (ZYRTEC) 10 MG tablet   albuterol (VENTOLIN HFA) 108 (90 Base) MCG/ACT inhaler   Encounter for immunization       Relevant Orders   Tdap vaccine greater than or equal to 7yo IM (Completed)   Essential hypertension  Relevant Medications   hydrochlorothiazide (HYDRODIURIL) 25 MG tablet     Plan  Needs pap  Shingles vaccine next visit  Started on HCTZ for BP  Started on claritin nightly for cough and albuterol inhaler  Recommended humidifier  Voltaren gel and OTC NSAIDS for joint pain  Return in about 3 months (around 09/14/2020).    Huston Foley Cattleya Dobratz, FNP-BC Primary Care at [place], [place] [address] Ph.  [phone] Fax [phone]  I have reviewed and agree with above documentation. Agustina Caroli, MD

## 2020-06-16 NOTE — Patient Instructions (Addendum)
Voltaren gel  Humidifier for at home    https://www.mata.com/.pdf">  DASH Eating Plan DASH stands for Dietary Approaches to Stop Hypertension. The DASH eating plan is a healthy eating plan that has been shown to:  Reduce high blood pressure (hypertension).  Reduce your risk for type 2 diabetes, heart disease, and stroke.  Help with weight loss. What are tips for following this plan? Reading food labels  Check food labels for the amount of salt (sodium) per serving. Choose foods with less than 5 percent of the Daily Value of sodium. Generally, foods with less than 300 milligrams (mg) of sodium per serving fit into this eating plan.  To find whole grains, look for the word "whole" as the first word in the ingredient list. Shopping  Buy products labeled as "low-sodium" or "no salt added."  Buy fresh foods. Avoid canned foods and pre-made or frozen meals. Cooking  Avoid adding salt when cooking. Use salt-free seasonings or herbs instead of table salt or sea salt. Check with your health care provider or pharmacist before using salt substitutes.  Do not fry foods. Cook foods using healthy methods such as baking, boiling, grilling, roasting, and broiling instead.  Cook with heart-healthy oils, such as olive, canola, avocado, soybean, or sunflower oil. Meal planning  Eat a balanced diet that includes: ? 4 or more servings of fruits and 4 or more servings of vegetables each day. Try to fill one-half of your plate with fruits and vegetables. ? 6-8 servings of whole grains each day. ? Less than 6 oz (170 g) of lean meat, poultry, or fish each day. A 3-oz (85-g) serving of meat is about the same size as a deck of cards. One egg equals 1 oz (28 g). ? 2-3 servings of low-fat dairy each day. One serving is 1 cup (237 mL). ? 1 serving of nuts, seeds, or beans 5 times each week. ? 2-3 servings of heart-healthy fats. Healthy fats called omega-3 fatty  acids are found in foods such as walnuts, flaxseeds, fortified milks, and eggs. These fats are also found in cold-water fish, such as sardines, salmon, and mackerel.  Limit how much you eat of: ? Canned or prepackaged foods. ? Food that is high in trans fat, such as some fried foods. ? Food that is high in saturated fat, such as fatty meat. ? Desserts and other sweets, sugary drinks, and other foods with added sugar. ? Full-fat dairy products.  Do not salt foods before eating.  Do not eat more than 4 egg yolks a week.  Try to eat at least 2 vegetarian meals a week.  Eat more home-cooked food and less restaurant, buffet, and fast food.   Lifestyle  When eating at a restaurant, ask that your food be prepared with less salt or no salt, if possible.  If you drink alcohol: ? Limit how much you use to:  0-1 drink a day for women who are not pregnant.  0-2 drinks a day for men. ? Be aware of how much alcohol is in your drink. In the [place] one drink equals one 12 oz bottle of beer (355 mL), one 5 oz glass of wine (148 mL), or one 1 oz glass of hard liquor (44 mL). General information  Avoid eating more than 2,300 mg of salt a day. If you have hypertension, you may need to reduce your sodium intake to 1,500 mg a day.  Work with your health care provider to maintain a healthy body weight or  to lose weight. Ask what an ideal weight is for you.  Get at least 30 minutes of exercise that causes your heart to beat faster (aerobic exercise) most days of the week. Activities may include walking, swimming, or biking.  Work with your health care provider or dietitian to adjust your eating plan to your individual calorie needs. What foods should I eat? Fruits All fresh, dried, or frozen fruit. Canned fruit in natural juice (without added sugar). Vegetables Fresh or frozen vegetables (raw, steamed, roasted, or grilled). Low-sodium or reduced-sodium tomato and vegetable juice. Low-sodium or  reduced-sodium tomato sauce and tomato paste. Low-sodium or reduced-sodium canned vegetables. Grains Whole-grain or whole-wheat bread. Whole-grain or whole-wheat pasta. Brown rice. [place]. Bulgur. Whole-grain and low-sodium cereals. Pita bread. Low-fat, low-sodium crackers. Whole-wheat flour tortillas. Meats and other proteins Skinless chicken or [place]. Ground chicken or [place]. Pork with fat trimmed off. Fish and seafood. Egg whites. Dried beans, peas, or lentils. Unsalted nuts, nut butters, and seeds. Unsalted canned beans. Lean cuts of beef with fat trimmed off. Low-sodium, lean precooked or cured meat, such as sausages or meat loaves. Dairy Low-fat (1%) or fat-free (skim) milk. Reduced-fat, low-fat, or fat-free cheeses. Nonfat, low-sodium ricotta or cottage cheese. Low-fat or nonfat yogurt. Low-fat, low-sodium cheese. Fats and oils Soft margarine without trans fats. Vegetable oil. Reduced-fat, low-fat, or light mayonnaise and salad dressings (reduced-sodium). Canola, safflower, olive, avocado, soybean, and sunflower oils. Avocado. Seasonings and condiments Herbs. Spices. Seasoning mixes without salt. Other foods Unsalted popcorn and pretzels. Fat-free sweets. The items listed above may not be a complete list of foods and beverages you can eat. Contact a dietitian for more information. What foods should I avoid? Fruits Canned fruit in a light or heavy syrup. Fried fruit. Fruit in cream or butter sauce. Vegetables Creamed or fried vegetables. Vegetables in a cheese sauce. Regular canned vegetables (not low-sodium or reduced-sodium). Regular canned tomato sauce and paste (not low-sodium or reduced-sodium). Regular tomato and vegetable juice (not low-sodium or reduced-sodium). Angie Fava. Olives. Grains Baked goods made with fat, such as croissants, muffins, or some breads. Dry pasta or rice meal packs. Meats and other proteins Fatty cuts of meat. Ribs. Fried meat. Berniece Salines. Bologna,  salami, and other precooked or cured meats, such as sausages or meat loaves. Fat from the back of a pig (fatback). Bratwurst. Salted nuts and seeds. Canned beans with added salt. Canned or smoked fish. Whole eggs or egg yolks. Chicken or [place] with skin. Dairy Whole or 2% milk, cream, and half-and-half. Whole or full-fat cream cheese. Whole-fat or sweetened yogurt. Full-fat cheese. Nondairy creamers. Whipped toppings. Processed cheese and cheese spreads. Fats and oils Butter. Stick margarine. Lard. Shortening. Ghee. Bacon fat. Tropical oils, such as coconut, palm kernel, or palm oil. Seasonings and condiments Onion salt, garlic salt, seasoned salt, table salt, and sea salt. Worcestershire sauce. Tartar sauce. Barbecue sauce. Teriyaki sauce. Soy sauce, including reduced-sodium. Steak sauce. Canned and packaged gravies. Fish sauce. Oyster sauce. Cocktail sauce. Store-bought horseradish. Ketchup. Mustard. Meat flavorings and tenderizers. Bouillon cubes. Hot sauces. Pre-made or packaged marinades. Pre-made or packaged taco seasonings. Relishes. Regular salad dressings. Other foods Salted popcorn and pretzels. The items listed above may not be a complete list of foods and beverages you should avoid. Contact a dietitian for more information. Where to find more information  National Heart, Lung, and Blood Institute: https://wilson-eaton.com/  American Heart Association: www.heart.org  Academy of Nutrition and Dietetics: www.eatright.[place]: www.kidney.org Summary  The DASH eating plan  is a healthy eating plan that has been shown to reduce high blood pressure (hypertension). It may also reduce your risk for type 2 diabetes, heart disease, and stroke.  When on the DASH eating plan, aim to eat more fresh fruits and vegetables, whole grains, lean proteins, low-fat dairy, and heart-healthy fats.  With the DASH eating plan, you should limit salt (sodium) intake to 2,300 mg a day. If you  have hypertension, you may need to reduce your sodium intake to 1,500 mg a day.  Work with your health care provider or dietitian to adjust your eating plan to your individual calorie needs. This information is not intended to replace advice given to you by your health care provider. Make sure you discuss any questions you have with your health care provider. Document Revised: 04/27/2019 Document Reviewed: 04/27/2019 Elsevier Patient Education  2021 [employer].    If you have lab work done today you will be contacted with your lab results within the next 2 weeks.  If you have not heard from [place] then please contact us. The fastest way to get your results is to register for My Chart.   IF you received an x-ray today, you will receive an invoice from [facility]. Please contact [facility] at [phone] with questions or concerns regarding your invoice.   IF you received labwork today, you will receive an invoice from [facility]. Please contact [facility] at [phone] with questions or concerns regarding your invoice.   Our billing staff will not be able to assist you with questions regarding bills from these companies.  You will be contacted with the lab results as soon as they are available. The fastest way to get your results is to activate your My Chart account. Instructions are located on the last page of this paperwork. If you have not heard from [place] regarding the results in 2 weeks, please contact this office.

## 2020-06-17 LAB — CMP14+EGFR
ALT: 18 IU/L (ref 0–32)
AST: 19 IU/L (ref 0–40)
Albumin/Globulin Ratio: 1.7 (ref 1.2–2.2)
Albumin: 4.6 g/dL (ref 3.8–4.9)
Alkaline Phosphatase: 101 IU/L (ref 44–121)
BUN/Creatinine Ratio: 16 (ref 9–23)
BUN: 11 mg/dL (ref 6–24)
Bilirubin Total: 0.6 mg/dL (ref 0.0–1.2)
CO2: 23 mmol/L (ref 20–29)
Calcium: 9.6 mg/dL (ref 8.7–10.2)
Chloride: 104 mmol/L (ref 96–106)
Creatinine, Ser: 0.67 mg/dL (ref 0.57–1.00)
GFR calc Af Amer: 115 mL/min/{1.73_m2} (ref >59)
GFR calc non Af Amer: 100 mL/min/{1.73_m2} (ref >59)
Globulin, Total: 2.7 g/dL (ref 1.5–4.5)
Glucose: 88 mg/dL (ref 65–99)
Potassium: 4.4 mmol/L (ref 3.5–5.2)
Sodium: 141 mmol/L (ref 134–144)
Total Protein: 7.3 g/dL (ref 6.0–8.5)

## 2020-06-17 LAB — TSH: TSH: 1.1 u[IU]/mL (ref 0.450–4.500)

## 2020-06-17 LAB — CBC
Hematocrit: 42.8 % (ref 34.0–46.6)
Hemoglobin: 14 g/dL (ref 11.1–15.9)
MCH: 28.3 pg (ref 26.6–33.0)
MCHC: 32.7 g/dL (ref 31.5–35.7)
MCV: 87 fL (ref 79–97)
Platelets: 274 10*3/uL (ref 150–450)
RBC: 4.94 x10E6/uL (ref 3.77–5.28)
RDW: 13.2 % (ref 11.7–15.4)
WBC: 4.7 10*3/uL (ref 3.4–10.8)

## 2020-06-17 LAB — LIPID PANEL
Chol/HDL Ratio: 3.3 ratio (ref 0.0–4.4)
Cholesterol, Total: 196 mg/dL (ref 100–199)
HDL: 59 mg/dL (ref >39)
LDL Chol Calc (NIH): 116 mg/dL — ABNORMAL HIGH (ref 0–99)
Triglycerides: 121 mg/dL (ref 0–149)
VLDL Cholesterol Cal: 21 mg/dL (ref 5–40)

## 2020-06-17 LAB — HEPATITIS C ANTIBODY: Hep C Virus Ab: <0.1 s/co ratio (ref 0.0–0.9)

## 2020-06-17 LAB — IRON,TIBC AND FERRITIN PANEL
Ferritin: 21 ng/mL (ref 15–150)
Iron Saturation: 18 % (ref 15–55)
Iron: 65 ug/dL (ref 27–159)
Total Iron Binding Capacity: 368 ug/dL (ref 250–450)
UIBC: 303 ug/dL (ref 131–425)

## 2020-06-17 LAB — HEMOGLOBIN A1C
Est. average glucose Bld gHb Est-mCnc: 108 mg/dL
Hgb A1c MFr Bld: 5.4 % (ref 4.8–5.6)

## 2020-06-17 LAB — VITAMIN D 25 HYDROXY (VIT D DEFICIENCY, FRACTURES): Vit D, 25-Hydroxy: 21.4 ng/mL — ABNORMAL LOW (ref 30.0–100.0)

## 2020-06-17 NOTE — Progress Notes (Signed)
If you could know overall her labs look good. Her vitamin D is low, I would recommend a daily replacement of OTC Vitamin 3 with food daily.

## 2020-06-23 ENCOUNTER — Ambulatory Visit: Payer: BLUE CROSS/BLUE SHIELD | Admitting: Family Medicine

## 2020-06-28 ENCOUNTER — Other Ambulatory Visit: Payer: Self-pay | Admitting: Family Medicine

## 2020-06-30 ENCOUNTER — Other Ambulatory Visit: Payer: Self-pay

## 2020-06-30 ENCOUNTER — Other Ambulatory Visit (HOSPITAL_COMMUNITY)
Admission: RE | Admit: 2020-06-30 | Discharge: 2020-06-30 | Disposition: A | Discharge status: Home / Self Care | Payer: BLUE CROSS/BLUE SHIELD | Source: Ambulatory Visit | Attending: Family Medicine | Admitting: Family Medicine

## 2020-06-30 ENCOUNTER — Ambulatory Visit: Payer: BLUE CROSS/BLUE SHIELD | Admitting: Family Medicine

## 2020-06-30 ENCOUNTER — Encounter: Payer: Self-pay | Admitting: Family Medicine

## 2020-06-30 VITALS — BP 135/83 | HR 69 | Temp 98.3°F | Ht 64.0 in | Wt 168.0 lb

## 2020-06-30 DIAGNOSIS — M25571 Pain in right ankle and joints of right foot: Secondary | ICD-10-CM | POA: Diagnosis not present

## 2020-06-30 DIAGNOSIS — Z01419 Encounter for gynecological examination (general) (routine) without abnormal findings: Secondary | ICD-10-CM | POA: Diagnosis not present

## 2020-06-30 DIAGNOSIS — I10 Essential (primary) hypertension: Secondary | ICD-10-CM

## 2020-06-30 MED ORDER — DICLOFENAC SODIUM 75 MG PO TBEC: 75.0000 mg | DELAYED_RELEASE_TABLET | Freq: Two times a day (BID) | ORAL | 0 refills | Status: DC

## 2020-06-30 NOTE — Patient Instructions (Addendum)
Ankle Sprain  An ankle sprain is a stretch or tear in one of the tough tissues (ligaments) that connect the bones in your ankle. An ankle sprain can happen when the ankle rolls outward (inversion sprain) or inward (eversion sprain). What are the causes? This condition is caused by rolling or twisting the ankle. What increases the risk? You are more likely to develop this condition if you play sports. What are the signs or symptoms? Symptoms of this condition include:  Pain in your ankle.  Swelling.  Bruising. This may happen right after you sprain your ankle or 1-2 days later.  Trouble standing or walking. How is this diagnosed? This condition is diagnosed with:  A physical exam. During the exam, your doctor will press on certain parts of your foot and ankle and try to move them in certain ways.  X-ray imaging. These may be taken to see how bad the sprain is and to check for broken bones. How is this treated? This condition may be treated with:  A brace or splint. This is used to keep the ankle from moving until it heals.  An elastic bandage. This is used to support the ankle.  Crutches.  Pain medicine.  Surgery. This may be needed if the sprain is very bad.  Physical therapy. This may help to improve movement in the ankle. Follow these instructions at home: If you have a brace or a splint:  Wear the brace or splint as told by your doctor. Remove it only as told by your doctor.  Loosen the brace or splint if your toes: ? Tingle. ? Lose feeling (become numb). ? Turn cold and blue.  Keep the brace or splint clean.  If the brace or splint is not waterproof: ? Do not let it get wet. ? Cover it with a watertight covering when you take a bath or a shower. If you have an elastic bandage (dressing):  Remove it to shower or bathe.  Try not to move your ankle much, but wiggle your toes from time to time. This helps to prevent swelling.  Adjust the dressing if it  feels too tight.  Loosen the dressing if your foot: ? Loses feeling. ? Tingles. ? Becomes cold and blue. Managing pain, stiffness, and swelling  Take over-the-counter and prescription medicines only as told by doctor.  For 2-3 days, keep your ankle raised (elevated) above the level of your heart.  If told, put ice on the injured area: ? If you have a removable brace or splint, remove it as told by your doctor. ? Put ice in a plastic bag. ? Place a towel between your skin and the bag. ? Leave the ice on for 20 minutes, 2-3 times a day.   General instructions  Rest your ankle.  Do not use your injured leg to support your body weight until your doctor says that you can. Use crutches as told by your doctor.  Do not use any products that contain nicotine or tobacco, such as cigarettes, e-cigarettes, and chewing tobacco. If you need help quitting, ask your doctor.  Keep all follow-up visits as told by your doctor. Contact a doctor if:  Your bruises or swelling are quickly getting worse.  Your pain does not get better after you take medicine. Get help right away if:  You cannot feel your toes or foot.  Your foot or toes look blue.  You have very bad pain that gets worse. Summary  An ankle sprain is  a stretch or tear in one of the tough tissues (ligaments) that connect the bones in your ankle.  This condition is caused by rolling or twisting the ankle.  Symptoms include pain, swelling, bruising, and trouble walking.  To help with pain and swelling, put ice on the injured ankle, raise your ankle above the level of your heart, and use an elastic bandage. Also, rest as told by your doctor.  Keep all follow-up visits as told by your doctor. This is important. This information is not intended to replace advice given to you by your health care provider. Make sure you discuss any questions you have with your health care provider. Document Revised: 10/18/2017 Document Reviewed:  10/18/2017 Elsevier Patient Education  2021 Elsevier Inc.  Pap Test Why am I having this test? A Pap test, also called a Pap smear, is a screening test to check for signs of:  Cancer of the vagina, cervix, and uterus. The cervix is the lower part of the uterus that opens into the vagina.  Infection.  Changes that may be a sign that cancer is developing (precancerous changes). Women need this test on a regular basis. In general, you should have a Pap test every 3 years until you reach menopause or age 61. Women aged 30-60 may choose to have their Pap test done at the same time as an HPV (human papillomavirus) test every 5 years (instead of every 3 years). Your health care provider may recommend having Pap tests more or less often depending on your medical conditions and past Pap test results. What kind of sample is taken? Your health care provider will collect a sample of cells from the surface of your cervix. This will be done using a small cotton swab, plastic spatula, or brush. This sample is often collected during a pelvic exam, when you are lying on your back on an exam table with feet in footrests (stirrups). In some cases, fluids (secretions) from the cervix or vagina may also be collected.   How do I prepare for this test?  Be aware of where you are in your menstrual cycle. If you are menstruating on the day of the test, you may be asked to reschedule.  You may need to reschedule if you have a known vaginal infection on the day of the test.  Follow instructions from your health care provider about: ? Changing or stopping your regular medicines. Some medicines can cause abnormal test results, such as digitalis and tetracycline. ? Avoiding douching or taking a bath the day before or the day of the test. Tell a health care provider about:  Any allergies you have.  All medicines you are taking, including vitamins, herbs, eye drops, creams, and over-the-counter medicines.  Any  blood disorders you have.  Any surgeries you have had.  Any medical conditions you have.  Whether you are pregnant or may be pregnant. How are the results reported? Your test results will be reported as either abnormal or normal. A false-positive result can occur. A false positive is incorrect because it means that a condition is present when it is not. A false-negative result can occur. A false negative is incorrect because it means that a condition is not present when it is. What do the results mean? A normal test result means that you do not have signs of cancer of the vagina, cervix, or uterus. An abnormal result may mean that you have:  Cancer. A Pap test by itself is not enough to diagnose  cancer. You will have more tests done in this case.  Precancerous changes in your vagina, cervix, or uterus.  Inflammation of the cervix.  An STD (sexually transmitted disease).  A fungal infection.  A parasite infection. Talk with your health care provider about what your results mean. Questions to ask your health care provider Ask your health care provider, or the department that is doing the test:  When will my results be ready?  How will I get my results?  What are my treatment options?  What other tests do I need?  What are my next steps? Summary  In general, women should have a Pap test every 3 years until they reach menopause or age 79.  Your health care provider will collect a sample of cells from the surface of your cervix. This will be done using a small cotton swab, plastic spatula, or brush.  In some cases, fluids (secretions) from the cervix or vagina may also be collected. This information is not intended to replace advice given to you by your health care provider. Make sure you discuss any questions you have with your health care provider. Document Revised: 01/30/2020 Document Reviewed: 01/25/2020 Elsevier Patient Education  2021 [employer].    If you have  lab work done today you will be contacted with your lab results within the next 2 weeks.  If you have not heard from [place] then please contact us. The fastest way to get your results is to register for My Chart.   IF you received an x-ray today, you will receive an invoice from [facility]. Please contact [facility]iology at [phone] with questions or concerns regarding your invoice.   IF you received labwork today, you will receive an invoice from [place] [facility]. Please contact [facility] at [phone] with questions or concerns regarding your invoice.   Our billing staff will not be able to assist you with questions regarding bills from these companies.  You will be contacted with the lab results as soon as they are available. The fastest way to get your results is to activate your My Chart account. Instructions are located on the last page of this paperwork. If you have not heard from [place] regarding the results in 2 weeks, please contact this office.

## 2020-06-30 NOTE — Progress Notes (Signed)
1/24/202210:35 AM  Ariana [name] Nov 27, 1965, 55 y.o., female [id number]  Chief Complaint  Patient presents with  . Gynecologic Exam  . Ankle Pain    Right ankle pain sudden onset 2 weeks ago - has used ankle brace , ibuprofen     HPI:   Patient is a 55 y.o. female with past medical history significant for HTN, IDA who presents today for annual pap.  HTN HCTZ 25mg  BP Readings from Last 3 Encounters:  06/30/20 135/83  06/16/20 (!) 148/90  09/18/19 138/86   Ankle pain Started hurting 2 weeks ago Did not fall Woke up with pain Has been using an ankle brace Declines Xray at this time Taking Ibuprofen for pain   LMP: 3 years ago Abnormal pap years ago Declines STI testing Declines abnormal discharge, bleeding, vaginal dryness  Depression screen [facility] 2/9 06/30/2020 06/16/2020 09/06/2019  Decreased Interest 0 0 0  Down, Depressed, Hopeless 0 0 0  PHQ - 2 Score 0 0 0    Fall Risk  06/30/2020 06/16/2020 09/06/2019 08/24/2019 05/16/2019  Falls in the past year? 0 0 0 0 0  Number falls in past yr: 0 0 0 0 0  Injury with Fall? 0 0 0 0 0  Follow up Falls evaluation completed Falls evaluation completed Falls evaluation completed Falls evaluation completed Falls evaluation completed     No Known Allergies  Prior to Admission medications   Medication Sig Start Date End Date Taking? Authorizing Provider  albuterol (VENTOLIN HFA) 108 (90 Base) MCG/ACT inhaler Inhale 2 puffs into the lungs every 6 (six) hours as needed for wheezing or shortness of breath. 06/16/20  Yes Sibbie Flammia, 08/14/20, FNP  cetirizine (ZYRTEC) 10 MG tablet Take 1 tablet (10 mg total) by mouth at bedtime. 06/16/20  Yes Woods Gangemi, 08/14/20, FNP  hydrochlorothiazide (HYDRODIURIL) 25 MG tablet Take 1 tablet (25 mg total) by mouth daily. 06/16/20  Yes Sanders Manninen, 08/14/20, FNP  Niacin (VITAMIN B-3 PO) Take by mouth.   Yes [provider]  PREMARIN vaginal cream PLACE 1 APPLICATORFUL VAGINALLY DAILY FOR 1 TO 2 WEEKS THEN 1  TO 3 TIMES PER WEEK 06/28/20  Yes Elzena Muston, 06/30/20, FNP    Past Medical History:  Diagnosis Date  . AC joint derangement 05/22/2019  . Anemia     Past Surgical History:  Procedure Laterality Date  . BREAST LUMPECTOMY WITH RADIOACTIVE SEED LOCALIZATION Right 09/18/2019   Procedure: RIGHT BREAST LUMPECTOMY WITH RADIOACTIVE SEED LOCALIZATION;  Surgeon: 09/20/2019, MD;  Location: [facility];  Service: General;  Laterality: Right;  . TUBAL LIGATION      Social History   Tobacco Use  . Smoking status: Never Smoker  . Smokeless tobacco: Never Used  Substance Use Topics  . Alcohol use: Yes    Comment: socially    Family History  Problem Relation Age of Onset  . Cancer Mother   . Hypertension Mother   . Breast cancer Mother 83  . Hypertension Father   . Colon cancer Neg Hx   . Esophageal cancer Neg Hx   . Rectal cancer Neg Hx   . Stomach cancer Neg Hx     Review of Systems  Constitutional: Negative for chills, fever and malaise/fatigue.  Eyes: Negative for blurred vision and double vision.  Respiratory: Negative for cough, shortness of breath and wheezing.   Cardiovascular: Negative for chest pain, palpitations and leg swelling.  Gastrointestinal: Negative for abdominal pain, blood in stool, constipation, diarrhea, heartburn, nausea  and vomiting.  Genitourinary: Negative for dysuria, frequency and hematuria.  Musculoskeletal: Positive for joint pain (right ankle). Negative for back pain and falls.  Skin: Negative for rash.  Neurological: Negative for dizziness, weakness and headaches.     OBJECTIVE:  Today's Vitals   06/30/20 1014  BP: 135/83  Pulse: 69  Temp: 98.3 F (36.8 C)  SpO2: 98%  Weight: 168 lb (76.2 kg)  Height: 5\' 4"  (1.626 m)   Body mass index is 28.84 kg/m.   Physical Exam Constitutional:      General: She is not in acute distress.    Appearance: Normal appearance. She is not ill-appearing.  HENT:     Head: Normocephalic.   Cardiovascular:     Rate and Rhythm: Normal rate and regular rhythm.     Pulses: Normal pulses.     Heart sounds: Normal heart sounds. No murmur heard. No friction rub. No gallop.   Pulmonary:     Effort: Pulmonary effort is normal. No respiratory distress.     Breath sounds: Normal breath sounds. No stridor. No wheezing, rhonchi or rales.  Abdominal:     General: Bowel sounds are normal.     Palpations: Abdomen is soft.     Tenderness: There is no abdominal tenderness.  Musculoskeletal:     Right lower leg: No edema.     Left lower leg: No edema.     Right ankle: Normal.     Left ankle: Normal.  Skin:    General: Skin is warm and dry.  Neurological:     Mental Status: She is alert and oriented to person, place, and time.  Psychiatric:        Mood and Affect: Mood normal.        Behavior: Behavior normal.    Pelvic exam: normal external genitalia, vulva, vagina, cervix, uterus and adnexa, VULVA: normal appearing vulva with no masses, tenderness or lesions, VAGINA: normal appearing vagina with normal color and discharge, no lesions, CERVIX: normal appearing cervix without discharge or lesions, UTERUS: uterus is normal size, shape, consistency and nontender, ADNEXA: normal adnexa in size, nontender and no masses, PAP: Pap smear done today, thin-prep method, HPV test, exam chaperoned by , LPN.   No results found for this or any previous visit (from the past 24 hour(s)).  No results found.   ASSESSMENT and PLAN  Problem List Items Addressed This Visit      Cardiovascular and Mediastinum   Essential hypertension - Primary    Other Visit Diagnoses    Encounter for cervical Pap smear with pelvic exam       Relevant Orders   Cytology - PAP([place])   Acute right ankle pain       Relevant Medications   diclofenac (VOLTAREN) 75 MG EC tablet      Plan . Continue HCTZ, BP at goal< 130/80 . Vitamin D started last visit . Will follow up with pap results . Declined  Xray at this time for ankle pain: suggested rest, ice, brace, elevation and diclofenac for pain . RTC precautions provided . Needs shingles vaccine   Return if symptoms worsen or fail to improve, for regularly scheduled follow up.    Larose Hires Annita Ratliff, FNP-BC Primary Care at [facility] [address] [place], [place] Ph.  [phone] Fax [phone]

## 2020-07-02 LAB — CYTOLOGY - PAP
Comment: NEGATIVE
Diagnosis: NEGATIVE
Diagnosis: REACTIVE
High risk HPV: NEGATIVE

## 2020-07-02 NOTE — Progress Notes (Signed)
PAP no issues and HPV negative. Repeat pap in 5 years.

## 2020-07-03 ENCOUNTER — Other Ambulatory Visit: Payer: Self-pay | Admitting: Family Medicine

## 2020-07-03 DIAGNOSIS — M25571 Pain in right ankle and joints of right foot: Secondary | ICD-10-CM

## 2020-07-04 ENCOUNTER — Other Ambulatory Visit: Payer: Self-pay | Admitting: Family Medicine

## 2020-07-04 DIAGNOSIS — Z1231 Encounter for screening mammogram for malignant neoplasm of breast: Secondary | ICD-10-CM

## 2020-08-11 ENCOUNTER — Inpatient Hospital Stay: Admission: RE | Admit: 2020-08-11 | Payer: BLUE CROSS/BLUE SHIELD | Source: Ambulatory Visit

## 2020-09-03 ENCOUNTER — Other Ambulatory Visit: Payer: Self-pay | Admitting: Family Medicine

## 2020-09-03 DIAGNOSIS — J45909 Unspecified asthma, uncomplicated: Secondary | ICD-10-CM

## 2020-09-08 ENCOUNTER — Ambulatory Visit: Payer: BLUE CROSS/BLUE SHIELD | Admitting: Family Medicine

## 2020-09-15 ENCOUNTER — Ambulatory Visit: Payer: BLUE CROSS/BLUE SHIELD | Admitting: Family Medicine

## 2020-09-29 ENCOUNTER — Encounter (HOSPITAL_COMMUNITY): Payer: Self-pay

## 2020-10-03 ENCOUNTER — Ambulatory Visit: Payer: BLUE CROSS/BLUE SHIELD

## 2020-11-21 ENCOUNTER — Other Ambulatory Visit: Payer: Self-pay

## 2020-11-21 ENCOUNTER — Ambulatory Visit
Admission: RE | Admit: 2020-11-21 | Discharge: 2020-11-21 | Disposition: A | Discharge status: Home / Self Care | Payer: BLUE CROSS/BLUE SHIELD | Source: Ambulatory Visit | Attending: Family Medicine | Admitting: Family Medicine

## 2020-11-21 DIAGNOSIS — Z1231 Encounter for screening mammogram for malignant neoplasm of breast: Secondary | ICD-10-CM

## 2021-01-08 DIAGNOSIS — J209 Acute bronchitis, unspecified: Secondary | ICD-10-CM | POA: Diagnosis not present

## 2021-01-08 DIAGNOSIS — R051 Acute cough: Secondary | ICD-10-CM | POA: Diagnosis not present

## 2021-01-08 DIAGNOSIS — J029 Acute pharyngitis, unspecified: Secondary | ICD-10-CM | POA: Diagnosis not present

## 2021-04-12 IMAGING — DX DG AC JOINTS*L*
2 series · 2 of 2 positions shown · non-contrast
Comparison: None.

CLINICAL DATA: Shoulder pain.

EXAM:
LEFT ACROMIOCLAVICULAR JOINTS

[ac joint w/weights]
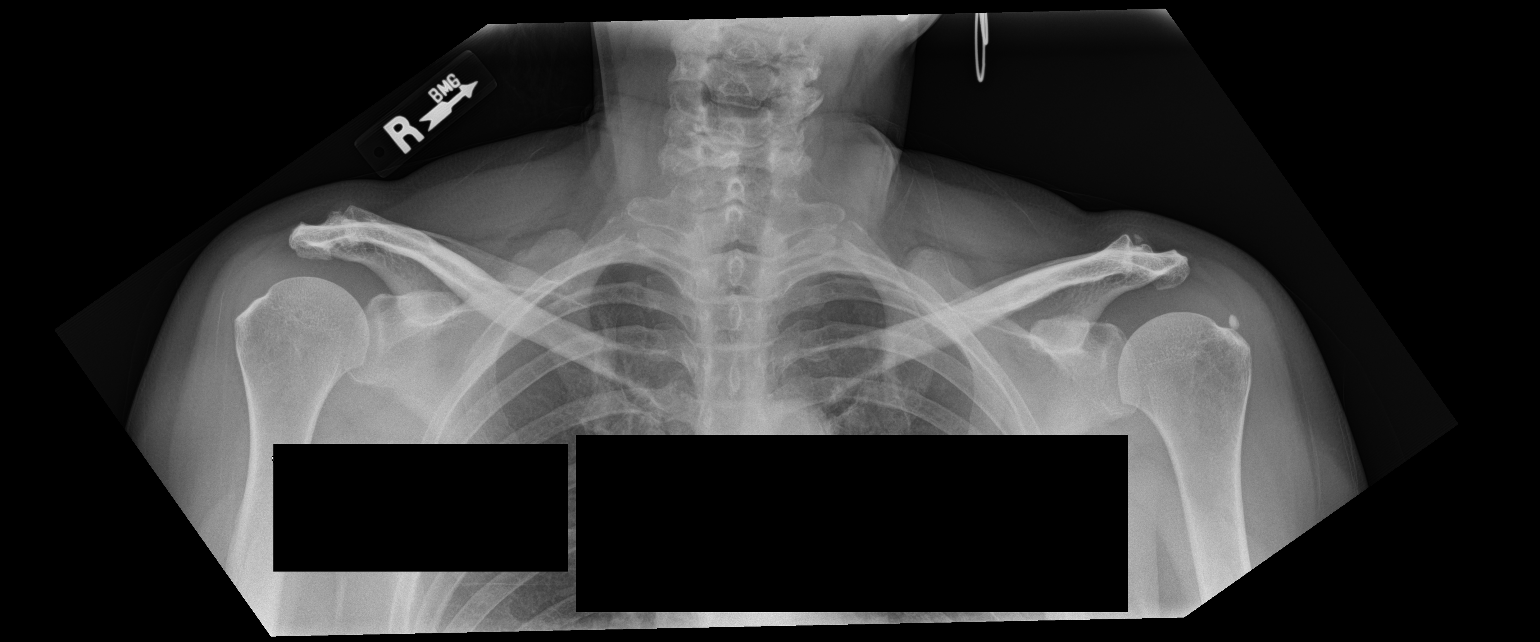

[ac joint w/oweights]
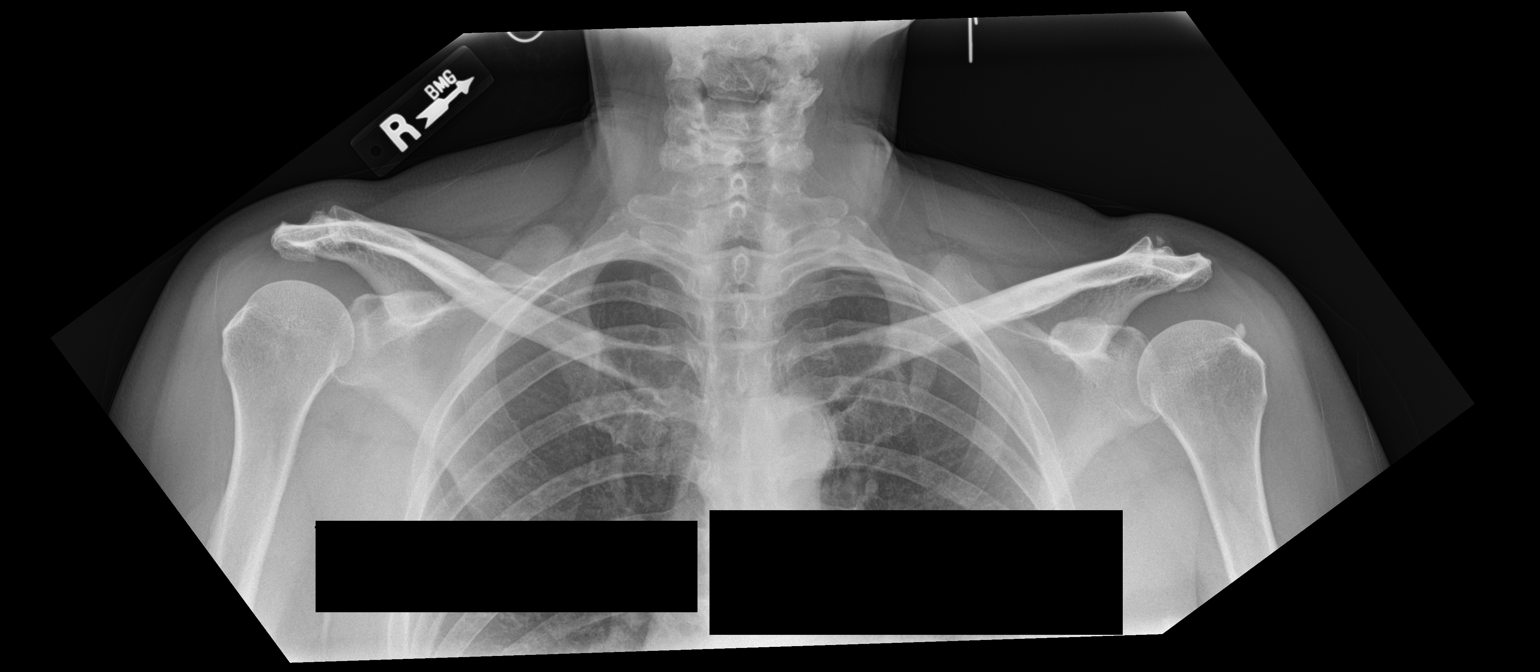

[2 of 2 positions shown; findings below may reference images not displayed]

FINDINGS: Mild degenerative changes involving both AC joints. No fracture or
evidence of AC joint separation. No change between the weighted and
un weighted images.

The sternoclavicular joints appear normal. Both shoulder joints are
unremarkable. There is a focus of probable calcific tendinitis or
bursitis involving the left shoulder. The lung apices are clear.
IMPRESSION: 1. Mild degenerative changes involving both AC joints but no
evidence for AC joint separation.
2. No acute bony findings.
3. Probable calcific tendinitis or bursitis involving the left
shoulder.

## 2021-07-03 IMAGING — MG MM BREAST BX W/ LOC DEV 1ST LESION IMAGE BX SPEC STEREO GUIDE*R*
6 of 10 series · 6 of 26 positions shown · non-contrast
Comparison: Previous exams.
COMPARISON: Previous exams.

Addendum:
CLINICAL DATA: 53-year-old female with distortion in the
upper-outer right breast.

EXAM:
RIGHT BREAST STEREOTACTIC CORE NEEDLE BIOPSY

[R (1 of 6)]
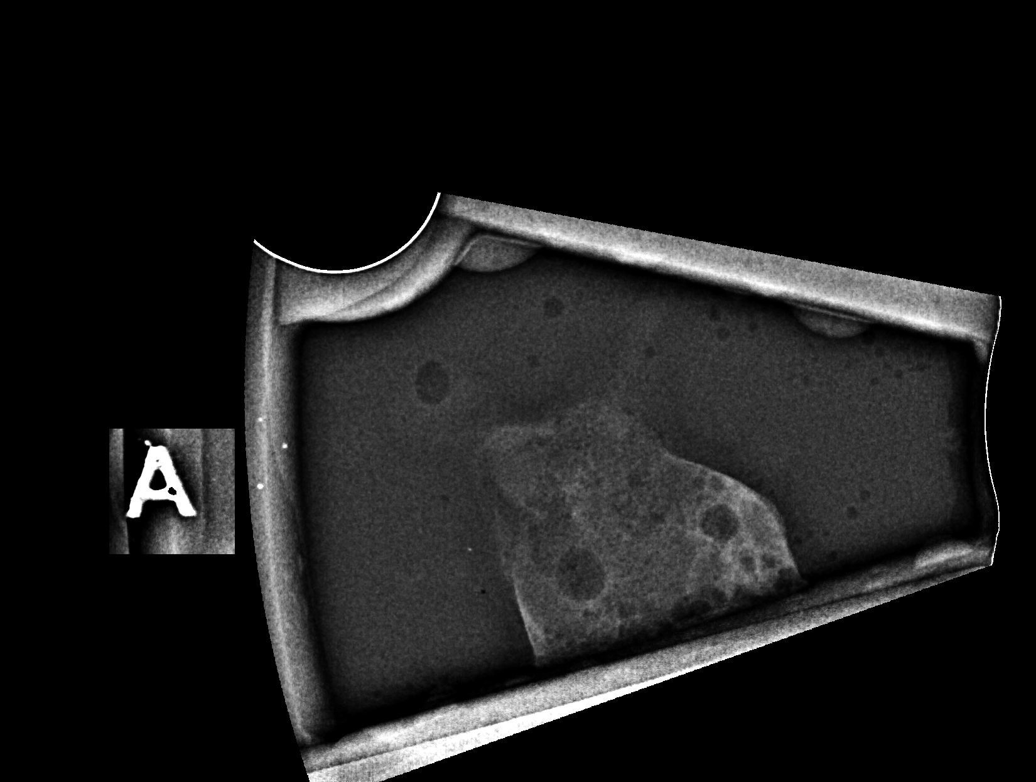

[R (2 of 6)]
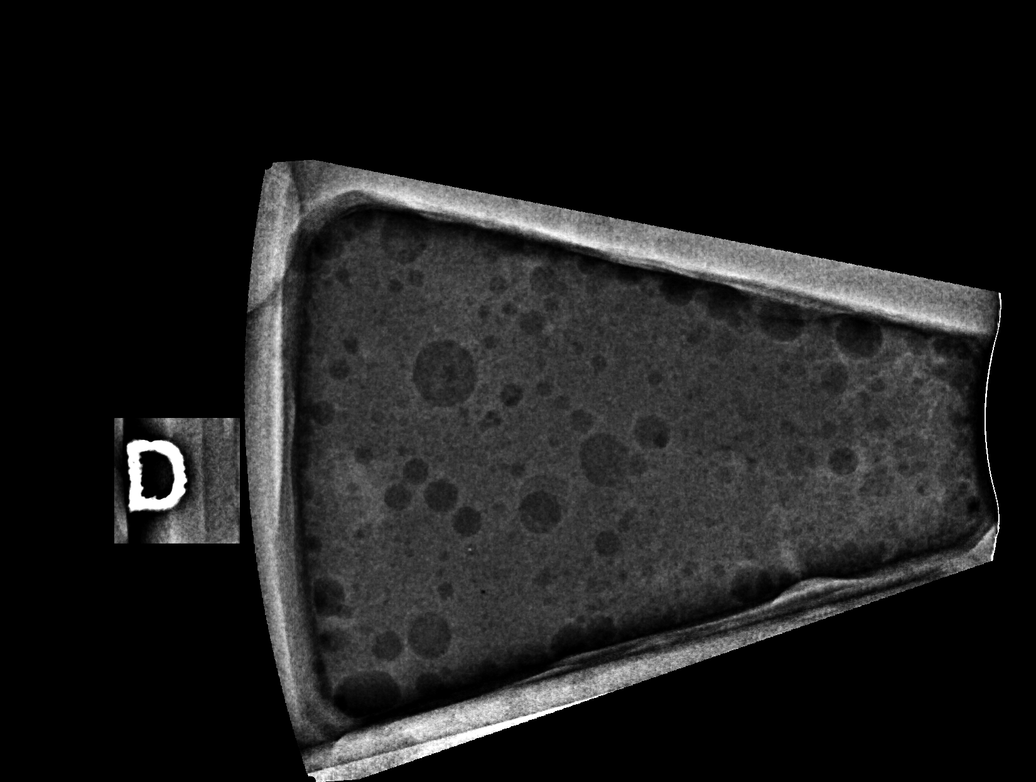

[R (3 of 6)]
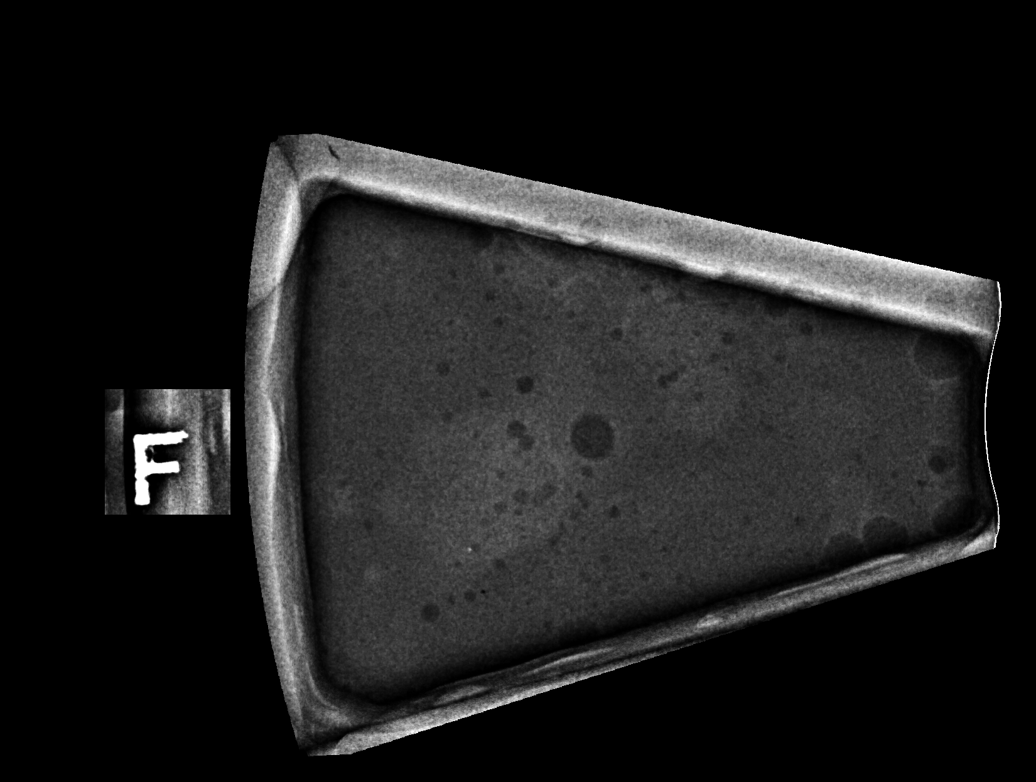

[R (4 of 6)]
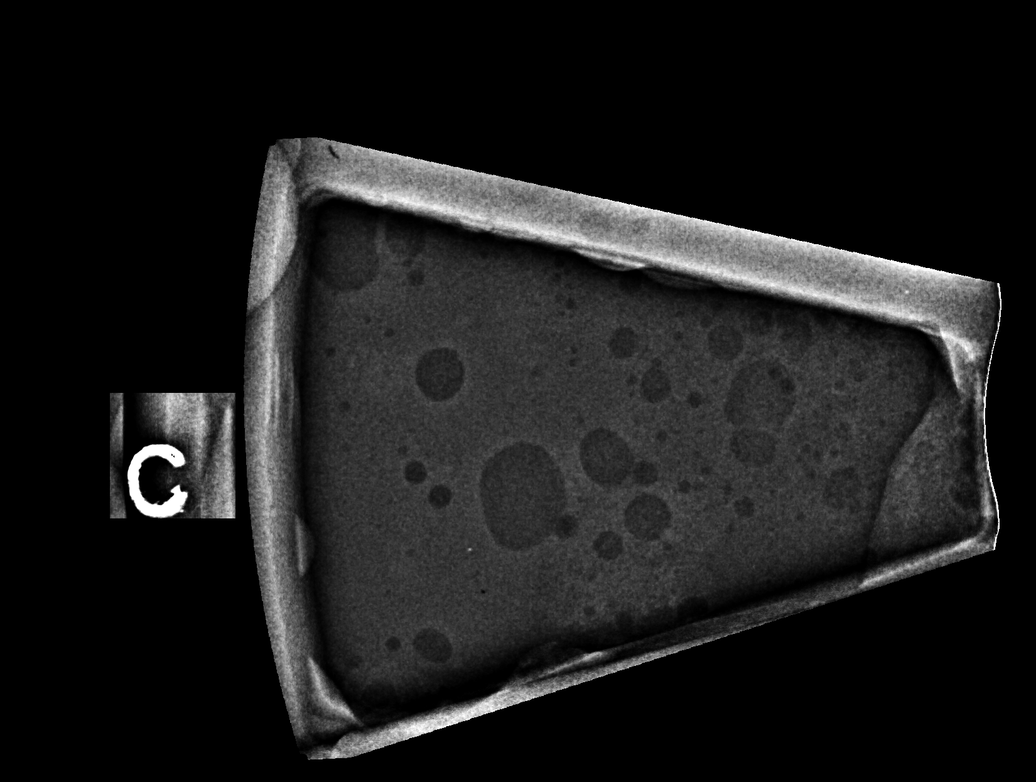

[R (5 of 6)]
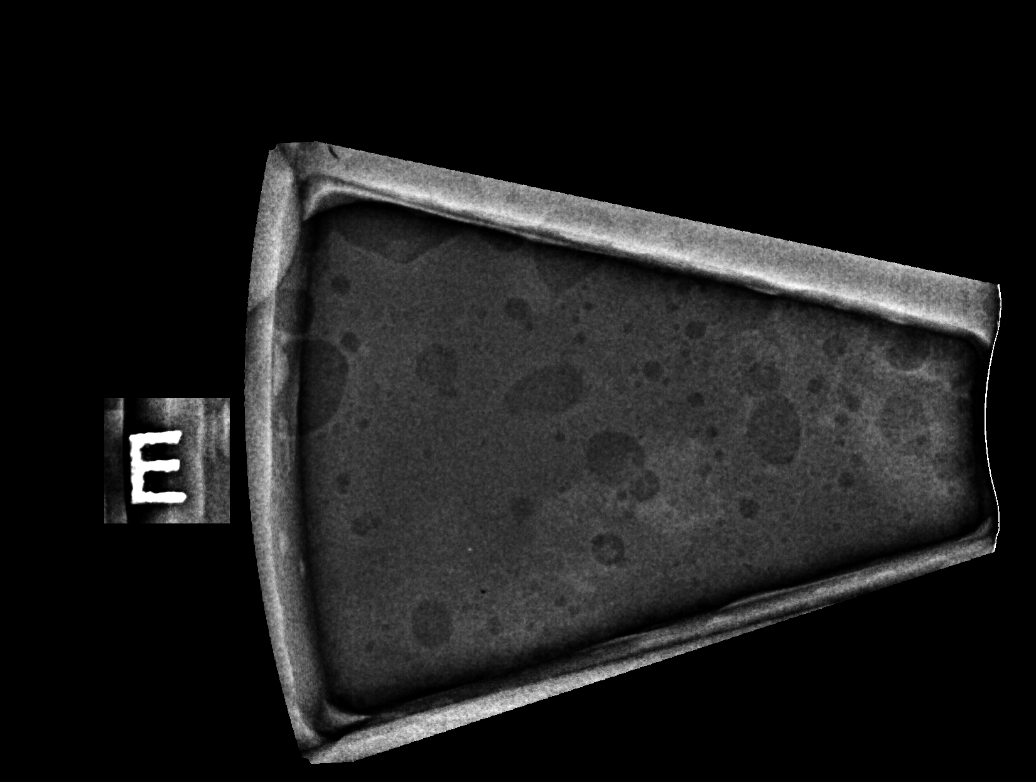

[R (6 of 6)]
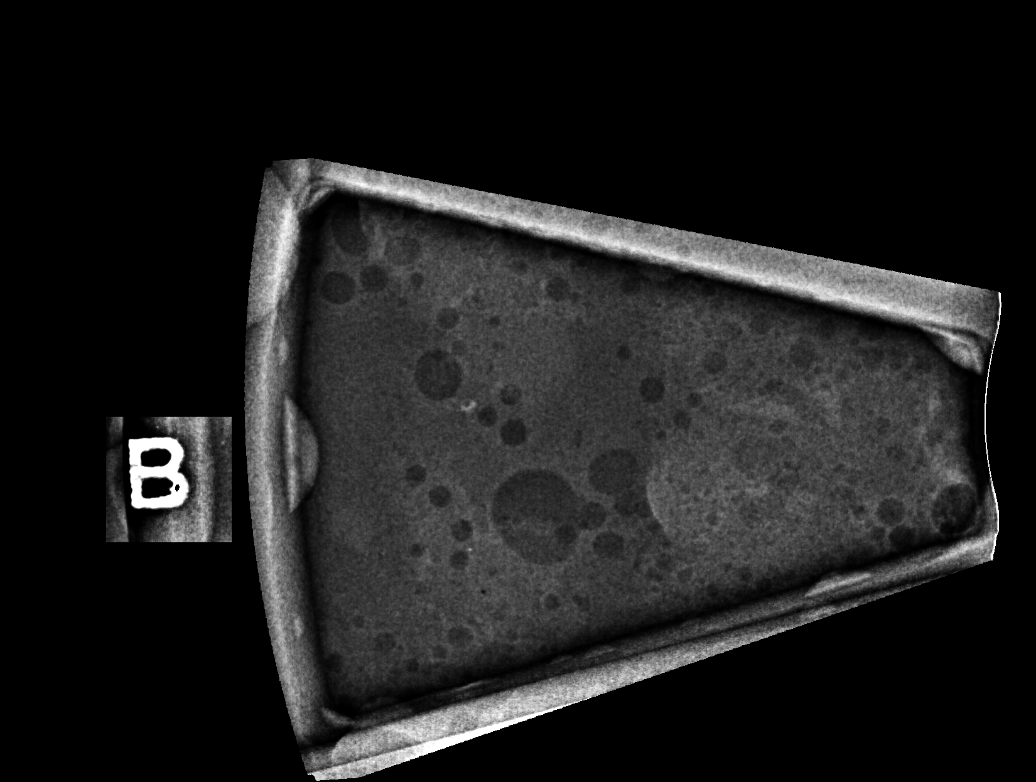

[6 of 26 positions shown; findings below may reference images not displayed]



Using sterile technique and 1% Lidocaine as local anesthetic, under
stereotactic guidance, a 9 gauge vacuum assisted device was used to
perform core needle biopsy of the distortion at the site of the
ribbon shaped biopsy marking clip in the upper-outer right breast
using a superior to inferior approach.

Lesion quadrant: Upper-outer

At the conclusion of the procedure, an X shaped tissue marker clip
was deployed into the biopsy cavity. Follow-up 2-view mammogram was
performed and dictated separately.
IMPRESSION: Stereotactic-guided biopsy of the distortion in the upper-outer
right breast. No apparent complications.

ADDENDUM:
Pathology revealed COMPLEX SCLEROSING LESION of the RIGHT breast,
upper outer quadrant. This was found to be concordant by Dr.
Rime Soka.

Pathology results were discussed with the patient by telephone. The
patient reported doing well after the biopsy with tenderness at the
site. Post biopsy instructions and care were reviewed and questions
were answered. The patient was encouraged to call The [REDACTED]

Surgical consultation has been arranged with Dr. Bambucafe Tarla at
[REDACTED] on August 24, 2019.

Pathology results reported by Shalley Jim RN on 08/07/2019.



Using sterile technique and 1% Lidocaine as local anesthetic, under
stereotactic guidance, a 9 gauge vacuum assisted device was used to
perform core needle biopsy of the distortion at the site of the
ribbon shaped biopsy marking clip in the upper-outer right breast
using a superior to inferior approach.

Lesion quadrant: Upper-outer

At the conclusion of the procedure, an X shaped tissue marker clip
was deployed into the biopsy cavity. Follow-up 2-view mammogram was
performed and dictated separately.
IMPRESSION: Stereotactic-guided biopsy of the distortion in the upper-outer
right breast. No apparent complications.

## 2021-07-06 DIAGNOSIS — N898 Other specified noninflammatory disorders of vagina: Secondary | ICD-10-CM | POA: Diagnosis not present

## 2021-09-24 DIAGNOSIS — N951 Menopausal and female climacteric states: Secondary | ICD-10-CM | POA: Diagnosis not present

## 2021-09-24 DIAGNOSIS — Z130 Encounter for screening for diseases of the blood and blood-forming organs and certain disorders involving the immune mechanism: Secondary | ICD-10-CM | POA: Diagnosis not present

## 2021-09-24 DIAGNOSIS — N898 Other specified noninflammatory disorders of vagina: Secondary | ICD-10-CM | POA: Diagnosis not present

## 2021-09-24 DIAGNOSIS — Z01419 Encounter for gynecological examination (general) (routine) without abnormal findings: Secondary | ICD-10-CM | POA: Diagnosis not present

## 2021-09-24 DIAGNOSIS — Z1389 Encounter for screening for other disorder: Secondary | ICD-10-CM | POA: Diagnosis not present

## 2021-12-14 DIAGNOSIS — Z1231 Encounter for screening mammogram for malignant neoplasm of breast: Secondary | ICD-10-CM | POA: Diagnosis not present

## 2023-02-09 DIAGNOSIS — N941 Unspecified dyspareunia: Secondary | ICD-10-CM | POA: Diagnosis not present

## 2023-02-09 DIAGNOSIS — Z1231 Encounter for screening mammogram for malignant neoplasm of breast: Secondary | ICD-10-CM | POA: Diagnosis not present

## 2023-02-09 DIAGNOSIS — Z01411 Encounter for gynecological examination (general) (routine) with abnormal findings: Secondary | ICD-10-CM | POA: Diagnosis not present

## 2023-02-09 DIAGNOSIS — N898 Other specified noninflammatory disorders of vagina: Secondary | ICD-10-CM | POA: Diagnosis not present

## 2023-02-09 DIAGNOSIS — Z01419 Encounter for gynecological examination (general) (routine) without abnormal findings: Secondary | ICD-10-CM | POA: Diagnosis not present

## 2023-06-15 DIAGNOSIS — I10 Essential (primary) hypertension: Secondary | ICD-10-CM | POA: Diagnosis not present

## 2023-06-15 DIAGNOSIS — J40 Bronchitis, not specified as acute or chronic: Secondary | ICD-10-CM | POA: Diagnosis not present

## 2023-06-15 DIAGNOSIS — R0789 Other chest pain: Secondary | ICD-10-CM | POA: Diagnosis not present

## 2023-06-15 DIAGNOSIS — R053 Chronic cough: Secondary | ICD-10-CM | POA: Diagnosis not present

## 2023-06-20 DIAGNOSIS — I10 Essential (primary) hypertension: Secondary | ICD-10-CM | POA: Diagnosis not present

## 2023-06-20 DIAGNOSIS — J209 Acute bronchitis, unspecified: Secondary | ICD-10-CM | POA: Diagnosis not present

## 2023-07-18 DIAGNOSIS — Z6829 Body mass index (BMI) 29.0-29.9, adult: Secondary | ICD-10-CM | POA: Diagnosis not present

## 2023-07-18 DIAGNOSIS — J4530 Mild persistent asthma, uncomplicated: Secondary | ICD-10-CM | POA: Diagnosis not present

## 2023-07-18 DIAGNOSIS — I10 Essential (primary) hypertension: Secondary | ICD-10-CM | POA: Diagnosis not present

## 2023-08-15 DIAGNOSIS — I10 Essential (primary) hypertension: Secondary | ICD-10-CM | POA: Diagnosis not present

## 2024-01-13 DIAGNOSIS — Z6828 Body mass index (BMI) 28.0-28.9, adult: Secondary | ICD-10-CM | POA: Diagnosis not present

## 2024-01-13 DIAGNOSIS — J4530 Mild persistent asthma, uncomplicated: Secondary | ICD-10-CM | POA: Diagnosis not present

## 2024-01-13 DIAGNOSIS — I10 Essential (primary) hypertension: Secondary | ICD-10-CM | POA: Diagnosis not present

## 2024-02-13 DIAGNOSIS — Z1231 Encounter for screening mammogram for malignant neoplasm of breast: Secondary | ICD-10-CM | POA: Diagnosis not present

## 2024-02-13 DIAGNOSIS — Z01419 Encounter for gynecological examination (general) (routine) without abnormal findings: Secondary | ICD-10-CM | POA: Diagnosis not present

## 2024-02-13 DIAGNOSIS — Z124 Encounter for screening for malignant neoplasm of cervix: Secondary | ICD-10-CM | POA: Diagnosis not present

## 2024-02-13 DIAGNOSIS — R87810 Cervical high risk human papillomavirus (HPV) DNA test positive: Secondary | ICD-10-CM | POA: Diagnosis not present

## 2024-02-13 DIAGNOSIS — Z130 Encounter for screening for diseases of the blood and blood-forming organs and certain disorders involving the immune mechanism: Secondary | ICD-10-CM | POA: Diagnosis not present

## 2024-02-13 DIAGNOSIS — Z1151 Encounter for screening for human papillomavirus (HPV): Secondary | ICD-10-CM | POA: Diagnosis not present

## 2024-05-14 DIAGNOSIS — N951 Menopausal and female climacteric states: Secondary | ICD-10-CM | POA: Diagnosis not present
